# Patient Record
Sex: Male | Born: 1990
Health system: Southern US, Community
[De-identification: ages and names within clinical notes are randomized; demographics above are authoritative.]

## PROBLEM LIST (undated history)

## (undated) DIAGNOSIS — K219 Gastro-esophageal reflux disease without esophagitis: Secondary | ICD-10-CM

## (undated) DIAGNOSIS — F1990 Other psychoactive substance use, unspecified, uncomplicated: Secondary | ICD-10-CM

## (undated) DIAGNOSIS — F1010 Alcohol abuse, uncomplicated: Secondary | ICD-10-CM

## (undated) DIAGNOSIS — F1210 Cannabis abuse, uncomplicated: Secondary | ICD-10-CM

## (undated) DIAGNOSIS — F1410 Cocaine abuse, uncomplicated: Secondary | ICD-10-CM

## (undated) DIAGNOSIS — Z720 Tobacco use: Secondary | ICD-10-CM

## (undated) HISTORY — PX: TYMPANOSTOMY TUBE PLACEMENT: SHX32

---
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
## 2015-08-31 ENCOUNTER — Emergency Department (HOSPITAL_BASED_OUTPATIENT_CLINIC_OR_DEPARTMENT_OTHER): Payer: Self-pay

## 2015-08-31 ENCOUNTER — Inpatient Hospital Stay (HOSPITAL_BASED_OUTPATIENT_CLINIC_OR_DEPARTMENT_OTHER)
Admission: EM | Admit: 2015-08-31 | Discharge: 2015-09-06 | DRG: 871 | Disposition: A | Discharge status: Home / Self Care | Payer: Self-pay | Attending: Internal Medicine | Admitting: Internal Medicine

## 2015-08-31 ENCOUNTER — Encounter (HOSPITAL_BASED_OUTPATIENT_CLINIC_OR_DEPARTMENT_OTHER): Payer: Self-pay | Admitting: *Deleted

## 2015-08-31 DIAGNOSIS — J189 Pneumonia, unspecified organism: Secondary | ICD-10-CM | POA: Diagnosis present

## 2015-08-31 DIAGNOSIS — K219 Gastro-esophageal reflux disease without esophagitis: Secondary | ICD-10-CM | POA: Diagnosis present

## 2015-08-31 DIAGNOSIS — Z792 Long term (current) use of antibiotics: Secondary | ICD-10-CM

## 2015-08-31 DIAGNOSIS — A419 Sepsis, unspecified organism: Principal | ICD-10-CM | POA: Diagnosis present

## 2015-08-31 DIAGNOSIS — F17200 Nicotine dependence, unspecified, uncomplicated: Secondary | ICD-10-CM | POA: Diagnosis present

## 2015-08-31 DIAGNOSIS — F1010 Alcohol abuse, uncomplicated: Secondary | ICD-10-CM | POA: Diagnosis present

## 2015-08-31 DIAGNOSIS — J90 Pleural effusion, not elsewhere classified: Secondary | ICD-10-CM | POA: Diagnosis present

## 2015-08-31 DIAGNOSIS — Z79899 Other long term (current) drug therapy: Secondary | ICD-10-CM

## 2015-08-31 DIAGNOSIS — J850 Gangrene and necrosis of lung: Secondary | ICD-10-CM | POA: Diagnosis present

## 2015-08-31 DIAGNOSIS — R0789 Other chest pain: Secondary | ICD-10-CM | POA: Diagnosis present

## 2015-08-31 DIAGNOSIS — F1210 Cannabis abuse, uncomplicated: Secondary | ICD-10-CM | POA: Diagnosis present

## 2015-08-31 DIAGNOSIS — Z720 Tobacco use: Secondary | ICD-10-CM | POA: Diagnosis present

## 2015-08-31 DIAGNOSIS — R7989 Other specified abnormal findings of blood chemistry: Secondary | ICD-10-CM

## 2015-08-31 DIAGNOSIS — I330 Acute and subacute infective endocarditis: Secondary | ICD-10-CM | POA: Diagnosis present

## 2015-08-31 DIAGNOSIS — R809 Proteinuria, unspecified: Secondary | ICD-10-CM | POA: Diagnosis present

## 2015-08-31 DIAGNOSIS — I76 Septic arterial embolism: Secondary | ICD-10-CM

## 2015-08-31 DIAGNOSIS — F1990 Other psychoactive substance use, unspecified, uncomplicated: Secondary | ICD-10-CM | POA: Diagnosis present

## 2015-08-31 DIAGNOSIS — R778 Other specified abnormalities of plasma proteins: Secondary | ICD-10-CM | POA: Diagnosis present

## 2015-08-31 DIAGNOSIS — R0602 Shortness of breath: Secondary | ICD-10-CM

## 2015-08-31 DIAGNOSIS — R079 Chest pain, unspecified: Secondary | ICD-10-CM

## 2015-08-31 DIAGNOSIS — R590 Localized enlarged lymph nodes: Secondary | ICD-10-CM | POA: Diagnosis present

## 2015-08-31 DIAGNOSIS — I2690 Septic pulmonary embolism without acute cor pulmonale: Secondary | ICD-10-CM | POA: Diagnosis present

## 2015-08-31 DIAGNOSIS — I38 Endocarditis, valve unspecified: Secondary | ICD-10-CM

## 2015-08-31 DIAGNOSIS — F1910 Other psychoactive substance abuse, uncomplicated: Secondary | ICD-10-CM

## 2015-08-31 DIAGNOSIS — F1410 Cocaine abuse, uncomplicated: Secondary | ICD-10-CM | POA: Diagnosis present

## 2015-08-31 DIAGNOSIS — Y900 Blood alcohol level of less than 20 mg/100 ml: Secondary | ICD-10-CM | POA: Diagnosis present

## 2015-08-31 HISTORY — DX: Cannabis abuse, uncomplicated: F12.10

## 2015-08-31 HISTORY — DX: Cocaine abuse, uncomplicated: F14.10

## 2015-08-31 HISTORY — DX: Alcohol abuse, uncomplicated: F10.10

## 2015-08-31 HISTORY — DX: Other psychoactive substance use, unspecified, uncomplicated: F19.90

## 2015-08-31 HISTORY — DX: Tobacco use: Z72.0

## 2015-08-31 HISTORY — DX: Gastro-esophageal reflux disease without esophagitis: K21.9

## 2015-08-31 LAB — COMPREHENSIVE METABOLIC PANEL
ALT: 23 U/L (ref 17–63)
ANION GAP: 12 (ref 5–15)
AST: 32 U/L (ref 15–41)
Albumin: 3.2 g/dL — ABNORMAL LOW (ref 3.5–5.0)
Alkaline Phosphatase: 61 U/L (ref 38–126)
BILIRUBIN TOTAL: 1.9 mg/dL — AB (ref 0.3–1.2)
BUN: 16 mg/dL (ref 6–20)
CO2: 25 mmol/L (ref 22–32)
Calcium: 8.7 mg/dL — ABNORMAL LOW (ref 8.9–10.3)
Chloride: 94 mmol/L — ABNORMAL LOW (ref 101–111)
Creatinine, Ser: 0.96 mg/dL (ref 0.61–1.24)
GFR calc Af Amer: >60 mL/min (ref >60)
Glucose, Bld: 116 mg/dL — ABNORMAL HIGH (ref 65–99)
POTASSIUM: 3.9 mmol/L (ref 3.5–5.1)
Sodium: 131 mmol/L — ABNORMAL LOW (ref 135–145)
TOTAL PROTEIN: 8 g/dL (ref 6.5–8.1)

## 2015-08-31 LAB — I-STAT VENOUS BLOOD GAS, ED
ACID-BASE EXCESS: 2 mmol/L (ref 0.0–2.0)
Bicarbonate: 27.6 mEq/L — ABNORMAL HIGH (ref 20.0–24.0)
O2 Saturation: 59 %
PH VEN: 7.396 — AB (ref 7.250–7.300)
TCO2: 29 mmol/L (ref 0–100)
pCO2, Ven: 45.3 mmHg (ref 45.0–50.0)
pO2, Ven: 32 mmHg (ref 31.0–45.0)

## 2015-08-31 LAB — TROPONIN I: Troponin I: 0.29 ng/mL (ref <0.03)

## 2015-08-31 LAB — URINE MICROSCOPIC-ADD ON

## 2015-08-31 LAB — CBC WITH DIFFERENTIAL/PLATELET
BASOS ABS: 0 10*3/uL (ref 0.0–0.1)
BASOS PCT: 0 %
Band Neutrophils: 12 %
Eosinophils Absolute: 0 10*3/uL (ref 0.0–0.7)
Eosinophils Relative: 0 %
HCT: 48.2 % (ref 39.0–52.0)
Hemoglobin: 17.2 g/dL — ABNORMAL HIGH (ref 13.0–17.0)
LYMPHS ABS: 1.3 10*3/uL (ref 0.7–4.0)
Lymphocytes Relative: 10 %
MCH: 31.7 pg (ref 26.0–34.0)
MCHC: 35.7 g/dL (ref 30.0–36.0)
MCV: 88.8 fL (ref 78.0–100.0)
MONO ABS: 1.7 10*3/uL — AB (ref 0.1–1.0)
Monocytes Relative: 13 %
NEUTROS PCT: 65 %
Neutro Abs: 10.2 10*3/uL — ABNORMAL HIGH (ref 1.7–7.7)
PLATELETS: 155 10*3/uL (ref 150–400)
RBC: 5.43 MIL/uL (ref 4.22–5.81)
RDW: 13.3 % (ref 11.5–15.5)
WBC: 13.2 10*3/uL — ABNORMAL HIGH (ref 4.0–10.5)

## 2015-08-31 LAB — URINALYSIS, ROUTINE W REFLEX MICROSCOPIC
Glucose, UA: 100 mg/dL — AB
Hgb urine dipstick: NEGATIVE
Ketones, ur: 15 mg/dL — AB
NITRITE: NEGATIVE
Protein, ur: 100 mg/dL — AB
SPECIFIC GRAVITY, URINE: 1.029 (ref 1.005–1.030)
pH: 6 (ref 5.0–8.0)

## 2015-08-31 LAB — LIPASE, BLOOD: Lipase: 38 U/L (ref 11–51)

## 2015-08-31 LAB — ETHANOL

## 2015-08-31 LAB — RAPID URINE DRUG SCREEN, HOSP PERFORMED
AMPHETAMINES: NOT DETECTED
BARBITURATES: NOT DETECTED
BENZODIAZEPINES: NOT DETECTED
Cocaine: POSITIVE — AB
Opiates: NOT DETECTED
TETRAHYDROCANNABINOL: POSITIVE — AB

## 2015-08-31 LAB — PROTIME-INR
INR: 1.1
Prothrombin Time: 14.2 seconds (ref 11.4–15.2)

## 2015-08-31 LAB — I-STAT CG4 LACTIC ACID, ED: Lactic Acid, Venous: 1.76 mmol/L (ref 0.5–1.9)

## 2015-08-31 LAB — CBG MONITORING, ED: GLUCOSE-CAPILLARY: 203 mg/dL — AB (ref 65–99)

## 2015-08-31 MED ORDER — SODIUM CHLORIDE 0.9 % IV SOLN
1000.0000 mL | Freq: Once | INTRAVENOUS | Status: AC
  Administered 2015-08-31: 1000 mL via INTRAVENOUS

## 2015-08-31 MED ORDER — SODIUM CHLORIDE 0.9 % IV BOLUS (SEPSIS)
1000.0000 mL | Freq: Once | INTRAVENOUS | Status: AC
  Administered 2015-08-31: 1000 mL via INTRAVENOUS

## 2015-08-31 MED ORDER — SODIUM CHLORIDE 0.9 % IV SOLN
1000.0000 mL | INTRAVENOUS | Status: DC
  Administered 2015-08-31: 1000 mL via INTRAVENOUS

## 2015-08-31 MED ORDER — DEXTROSE 5 % IV SOLN
2.0000 g | Freq: Once | INTRAVENOUS | Status: AC
  Administered 2015-08-31: 2 g via INTRAVENOUS
  Filled 2015-08-31: qty 2

## 2015-08-31 MED ORDER — IBUPROFEN 400 MG PO TABS
600.0000 mg | ORAL_TABLET | Freq: Once | ORAL | Status: AC
  Administered 2015-08-31: 600 mg via ORAL
  Filled 2015-08-31: qty 1

## 2015-08-31 MED ORDER — VANCOMYCIN HCL IN DEXTROSE 1-5 GM/200ML-% IV SOLN
1000.0000 mg | Freq: Once | INTRAVENOUS | Status: AC
  Administered 2015-08-31: 1000 mg via INTRAVENOUS
  Filled 2015-08-31: qty 200

## 2015-08-31 MED ORDER — IOPAMIDOL (ISOVUE-370) INJECTION 76%
100.0000 mL | Freq: Once | INTRAVENOUS | Status: AC | PRN
  Administered 2015-08-31: 100 mL via INTRAVENOUS

## 2015-08-31 NOTE — Progress Notes (Signed)
This is a no charge note  [facility] Dr[facility] Donnald GarrePfeiffer  25 year old male with a past medical history of IV drug use, cocaine abuse, tobacco abuse, 1 amount of abuse, who presents with fever, weakness, chest pain, headache, nausea, vomiting.  Per report, pt drank alcohol and smoked "pot" 6 days ago, then started having fever, weakness, chest pain, headache, nausea, vomiting.  Pt was found to have bilateral septic emboli involving all lobes of both lungs by CTA. There is evidence of central necrosis involving right upper and lower lobe lesions. Partially loculated right pleural effusion, possible loculated pleural fluid versus less likely pericardial fluid. Troponin 0.29, positive UDS for cocaine and PSC, urinalysis with trace amount of leukocyte, lactate 1.76, temperature 102.8, tachycardia, tachypnea, oxygen saturation 78% on room air, blood pressure 98/67, electrolytes and renal function okay. CT-head is negative for acute intracranial abnormalities. CTA of abdomen/pelvis is not impressive. Patient is accepted to stepdown bed as inpatient. IV rocephin and vancomycin were given in ED.  Lorretta HarpXilin Jhordan Kinter, MD  [facility] Pager [phone]  If 7PM-7AM, please contact night-coverage [email or web address] Password [email or web address] 08/31/2015, 10:45 PM

## 2015-08-31 NOTE — ED Provider Notes (Signed)
[facility] Provider Note   CSN: [id number] Arrival date & time: 08/31/15  1704   By signing my name below, I, [name], attest that this documentation has been prepared under the direction and in the presence of Arby Barrette, MD. Electronically Signed: [name], ED Scribe. 08/31/15. 6:50 PM.   History   Chief Complaint Chief Complaint  Patient presents with  . Weakness    HPI  Kenneth Davis is a 25 y.o. male who presents to the Emergency Department complaining of weakness onset 5 days. Pt reports that 6 days ago, he went out partying and then 5 days ago in the early morning, he woke up in his room with EMS surrounding him. Pt states that his friends called EMS and he refused treatment. Pt states that he was drinking ETOH, smoking marijuana, and xanax while partying. Pt states that he has used IV drugs in the past and he has injected cocaine. Pt notes that he is trying to stop using cocaine at this time. Pt notes that he has been in the bed for the past 5 days. Pt reports that he is unsure if he was injured while partying. He states that he is having associated symptoms of throbbing HA, vomiting x 3 episodes with his last episode being 2 days ago, right side pain, and chills. He states that he has tried Gatorade and sprite with no relief for his symptoms. He denies fever and any other symptoms.     The history is provided by the patient. No language interpreter was used.    History reviewed. No pertinent past medical history.  Patient Active Problem List   Diagnosis Date Noted  . Endocarditis 09/01/2015    History reviewed. No pertinent surgical history.     Home Medications    Prior to Admission medications   Medication Sig Start Date End Date Taking? Authorizing Provider  doxycycline (DORYX) 100 MG EC tablet Take 100 mg by mouth 2 (two) times daily.      Historical Provider, MD  famotidine (PEPCID) 20 MG tablet Take 1 tablet (20 mg total) by mouth  2 (two) times daily. 10/27/10 10/27/11  Linwood Dibbles, MD    Family History No family history on file.  Social History Social History  Substance Use Topics  . Smoking status: Current Some Day Smoker  . Smokeless tobacco: Never Used  . Alcohol use Yes     Allergies   Review of patient's allergies indicates no known allergies.   Review of Systems Review of Systems  Constitutional: Positive for chills. Negative for fever.  Gastrointestinal: Positive for vomiting.  Musculoskeletal: Positive for arthralgias (right side).  Neurological: Positive for weakness and headaches.     Physical Exam Updated Vital Signs BP 98/67   Pulse 102   Temp 102.8 F (39.3 C) (Oral)   Resp 25   Ht 5\' 10"  (1.778 m)   Wt 140 lb (63.5 kg)   SpO2 95%   BMI 20.09 kg/m   Physical Exam  Constitutional: He is oriented to person, place, and time. He appears well-developed and well-nourished. No distress.  HENT:  Head: Normocephalic and atraumatic.  Plaque on tongue, oral thrush, gingivitis.  Eyes: EOM are normal. Scleral icterus is present.  Scleral icterus.   Neck: Neck supple.  Cardiovascular: Normal rate, regular rhythm and normal heart sounds.  Exam reveals no gallop and no friction rub.   No murmur heard. Pulmonary/Chest: Effort normal. No respiratory distress. He has decreased breath sounds in the  right upper field, the right middle field and the right lower field. He has no wheezes. He has no rales.  Decreased breathe sounds on right  Abdominal: Soft. He exhibits no distension. There is tenderness in the right lower quadrant. There is CVA tenderness.  Right CVA tenderness. RLQ pain to palpation.   Musculoskeletal: Normal range of motion. He exhibits no edema.  No peripheral edema.   Neurological: He is alert and oriented to person, place, and time.  Skin: Skin is warm and dry.  Psychiatric: He has a normal mood and affect. His behavior is normal.  Nursing note and vitals  reviewed.    ED Treatments / Results  DIAGNOSTIC STUDIES: Oxygen Saturation is 97% on RA, nl by my interpretation.    COORDINATION OF CARE: 6:48 PM Discussed treatment plan with pt at bedside which includes UA, labs, and CXR, and pt agreed to plan.   Labs (all labs ordered are listed, but only abnormal results are displayed) Labs Reviewed  URINALYSIS, ROUTINE W REFLEX MICROSCOPIC (NOT AT [facility]) - Abnormal; Notable for the following:       Result Value   Color, Urine ORANGE (*)    Glucose, UA 100 (*)    Bilirubin Urine SMALL (*)    Ketones, ur 15 (*)    Protein, ur 100 (*)    Leukocytes, UA TRACE (*)    All other components within normal limits  URINE MICROSCOPIC-ADD ON - Abnormal; Notable for the following:    Squamous Epithelial / LPF 0-5 (*)    Bacteria, UA FEW (*)    All other components within normal limits  COMPREHENSIVE METABOLIC PANEL - Abnormal; Notable for the following:    Sodium 131 (*)    Chloride 94 (*)    Glucose, Bld 116 (*)    Calcium 8.7 (*)    Albumin 3.2 (*)    Total Bilirubin 1.9 (*)    All other components within normal limits  CBC WITH DIFFERENTIAL/PLATELET - Abnormal; Notable for the following:    WBC 13.2 (*)    Hemoglobin 17.2 (*)    Neutro Abs 10.2 (*)    Monocytes Absolute 1.7 (*)    All other components within normal limits  TROPONIN I - Abnormal; Notable for the following:    Troponin I 0.29 (*)    All other components within normal limits  URINE RAPID DRUG SCREEN, HOSP PERFORMED - Abnormal; Notable for the following:    Cocaine POSITIVE (*)    Tetrahydrocannabinol POSITIVE (*)    All other components within normal limits  CBG MONITORING, ED - Abnormal; Notable for the following:    Glucose-Capillary 203 (*)    All other components within normal limits  I-STAT VENOUS BLOOD GAS, ED - Abnormal; Notable for the following:    pH, Ven 7.396 (*)    Bicarbonate 27.6 (*)    All other components within normal limits  CULTURE, BLOOD (ROUTINE X  2)  CULTURE, BLOOD (ROUTINE X 2)  CULTURE, BLOOD (ROUTINE X 2)  CULTURE, BLOOD (ROUTINE X 2)  ETHANOL  LIPASE, BLOOD  PROTIME-INR  BLOOD GAS, VENOUS  I-STAT CG4 LACTIC ACID, ED    EKG  EKG Interpretation None       Radiology Dg Chest 2 View  Result Date: 08/31/2015 CLINICAL DATA:  Right-sided chest pain. EXAM: CHEST  2 VIEW COMPARISON:  None. FINDINGS: Mild right upper lobe airspace opacity. No other evidence of focal consolidation. No pulmonary edema. Normal cardiomediastinal contours. No pleural effusion or  pneumothorax. IMPRESSION: Mild right upper lobe airspace opacity could indicate developing infection. Otherwise normal chest radiograph. Electronically Signed   By: Deatra Robinson M.D.   On: 08/31/2015 19:16   Ct Head Wo Contrast  Result Date: 08/31/2015 CLINICAL DATA:  Patient was drinking and doing drugs last night and now complains of chest pain radiating to the mid abdomen. Headache. EXAM: CT HEAD WITHOUT CONTRAST TECHNIQUE: Contiguous axial images were obtained from the base of the skull through the vertex without intravenous contrast. COMPARISON:  06/21/2008 FINDINGS: Brain: Examination technically limited due to motion artifact. Ventricles and sulci appear symmetrical. No ventricular dilatation. No mass effect or midline shift. No abnormal extra-axial fluid collections. Gray-white matter junctions are distinct. Basal cisterns are not effaced. No evidence of acute intracranial hemorrhage. Vascular: No hyperdense vessel or unexpected calcification. Skull: No depressed skull fractures. Sinuses/Orbits: No acute finding. Other: No significant changes since previous study. IMPRESSION: No acute intracranial abnormalities. Electronically Signed   By: Burman Nieves M.D.   On: 08/31/2015 21:22   Ct Angio Chest Aorta W/cm &/or Wo/cm  Result Date: 08/31/2015 CLINICAL DATA:  Chest pain radiating to the abdomen. History of IV drug use. EXAM: CT ANGIOGRAPHY CHEST, ABDOMEN AND PELVIS  TECHNIQUE: Multidetector CT imaging through the chest, abdomen and pelvis was performed using the standard protocol during bolus administration of intravenous contrast. Multiplanar reconstructed images and MIPs were obtained and reviewed to evaluate the vascular anatomy. CONTRAST:  100 cc Isovue 370 IV COMPARISON:  Chest radiograph earlier this day. FINDINGS: CTA CHEST FINDINGS Cardiovascular: No aortic dissection, aneurysm, hematoma, or acute aortic syndrome. Thoracic aorta is normal in caliber. Heart is normal in size. No filling defects in the central pulmonary arteries. Mediastinum/nodes: Fluid abutting the right mediastinal border measuring up to 3.4 cm may be a loculated pleural effusion versus less likely pericardial effusion. This is heterogeneous fluid density with minimal peripheral but no internal enhancement. Enlarged right hilar node measures 14 mm. Small prevascular nodes. There is a prominent subcarinal node. Probable epicardial node. Lungs/pleura: Multifocal pulmonary nodules in a pattern consistent with septic emboli/infection. These are bilateral and in falls all lobes of both lungs. Greatest burden is in the upper lobes were there is probable early cavitation. Nodule conglomerate in the upper lobe measures 3.4 x 1.9 cm. Within the medial right lung base his consolidation with central air, suspicious for necrotizing infection/ pneumonia. There is a partially loculated right pleural effusion measuring up to 2 cm, some peripheral enhancement. Trace left pleural effusion. Musculoskeletal: There are no acute or suspicious osseous abnormalities. Review of the MIP images confirms the above findings. CTA ABDOMEN AND PELVIS FINDINGS Aorta/vascular: Abdominal aorta is normal in caliber. There is no dissection, aneurysm, or acute aortic syndrome. Normal variant anatomy with small likely a accessory hepatic Ing gastric artery arising directly from the aorta. Celiac, superior mesenteric, and inferior  mesenteric arteries are patent. Single bilateral renal arteries are patent. Normal caliber bilateral common and external iliac arteries. Liver: Normal for arterial phase imaging. Hepatobiliary: Gallbladder physiologically distended, no calcified stone. No biliary dilatation. Pancreas: No ductal dilatation or inflammation. Spleen: Normal for arterial phase imaging. Adrenal glands: No nodule. Kidneys: Symmetric renal enhancement. No hydronephrosis. No perinephric edema. Stomach/Bowel: Stomach physiologically distended. There are no dilated or thickened small bowel loops. Small volume of stool throughout the colon without colonic wall thickening. The appendix is normal. Vascular/Lymphatic: No retroperitoneal, mesenteric, or pelvic adenopathy. Reproductive: No acute abnormality. Bladder: Physiologically distended without wall thickening. Other: No free air, free  fluid, or intra-abdominal fluid collection. Musculoskeletal: There are no acute or suspicious osseous abnormalities. Review of the MIP images confirms the above findings. IMPRESSION: 1. Findings consistent with bilateral septic emboli involving all lobes of both lungs. There is evidence of central necrosis involving right upper and lower lobe lesions. Partially loculated right pleural effusion. Fluid adjacent of right heart border is likely loculated pleural fluid versus less likely pericardial fluid. Mediastinal and right hilar adenopathy is likely reactive. 2. No aortic dissection. 3. No acute abnormality in the abdomen/pelvis. These results were called by telephone at the time of interpretation on 08/31/2015 at 10:04 pm to Dr. Arby Barrette , who verbally acknowledged these results. Electronically Signed   By: Rubye Oaks M.D.   On: 08/31/2015 22:05   Ct Angio Abd/pel W/ And/or W/o  Result Date: 08/31/2015 CLINICAL DATA:  Chest pain radiating to the abdomen. History of IV drug use. EXAM: CT ANGIOGRAPHY CHEST, ABDOMEN AND PELVIS TECHNIQUE:  Multidetector CT imaging through the chest, abdomen and pelvis was performed using the standard protocol during bolus administration of intravenous contrast. Multiplanar reconstructed images and MIPs were obtained and reviewed to evaluate the vascular anatomy. CONTRAST:  100 cc Isovue 370 IV COMPARISON:  Chest radiograph earlier this day. FINDINGS: CTA CHEST FINDINGS Cardiovascular: No aortic dissection, aneurysm, hematoma, or acute aortic syndrome. Thoracic aorta is normal in caliber. Heart is normal in size. No filling defects in the central pulmonary arteries. Mediastinum/nodes: Fluid abutting the right mediastinal border measuring up to 3.4 cm may be a loculated pleural effusion versus less likely pericardial effusion. This is heterogeneous fluid density with minimal peripheral but no internal enhancement. Enlarged right hilar node measures 14 mm. Small prevascular nodes. There is a prominent subcarinal node. Probable epicardial node. Lungs/pleura: Multifocal pulmonary nodules in a pattern consistent with septic emboli/infection. These are bilateral and in falls all lobes of both lungs. Greatest burden is in the upper lobes were there is probable early cavitation. Nodule conglomerate in the upper lobe measures 3.4 x 1.9 cm. Within the medial right lung base his consolidation with central air, suspicious for necrotizing infection/ pneumonia. There is a partially loculated right pleural effusion measuring up to 2 cm, some peripheral enhancement. Trace left pleural effusion. Musculoskeletal: There are no acute or suspicious osseous abnormalities. Review of the MIP images confirms the above findings. CTA ABDOMEN AND PELVIS FINDINGS Aorta/vascular: Abdominal aorta is normal in caliber. There is no dissection, aneurysm, or acute aortic syndrome. Normal variant anatomy with small likely a accessory hepatic Ing gastric artery arising directly from the aorta. Celiac, superior mesenteric, and inferior mesenteric arteries  are patent. Single bilateral renal arteries are patent. Normal caliber bilateral common and external iliac arteries. Liver: Normal for arterial phase imaging. Hepatobiliary: Gallbladder physiologically distended, no calcified stone. No biliary dilatation. Pancreas: No ductal dilatation or inflammation. Spleen: Normal for arterial phase imaging. Adrenal glands: No nodule. Kidneys: Symmetric renal enhancement. No hydronephrosis. No perinephric edema. Stomach/Bowel: Stomach physiologically distended. There are no dilated or thickened small bowel loops. Small volume of stool throughout the colon without colonic wall thickening. The appendix is normal. Vascular/Lymphatic: No retroperitoneal, mesenteric, or pelvic adenopathy. Reproductive: No acute abnormality. Bladder: Physiologically distended without wall thickening. Other: No free air, free fluid, or intra-abdominal fluid collection. Musculoskeletal: There are no acute or suspicious osseous abnormalities. Review of the MIP images confirms the above findings. IMPRESSION: 1. Findings consistent with bilateral septic emboli involving all lobes of both lungs. There is evidence of central necrosis involving right upper  and lower lobe lesions. Partially loculated right pleural effusion. Fluid adjacent of right heart border is likely loculated pleural fluid versus less likely pericardial fluid. Mediastinal and right hilar adenopathy is likely reactive. 2. No aortic dissection. 3. No acute abnormality in the abdomen/pelvis. These results were called by telephone at the time of interpretation on 08/31/2015 at 10:04 pm to Dr. Arby BarretteMARCY Octavion Mollenkopf , who verbally acknowledged these results. Electronically Signed   By: Rubye OaksMelanie  Ehinger M.D.   On: 08/31/2015 22:05    Procedures Procedures (including critical care time) CRITICAL CARE Performed by: Arby BarrettePfeiffer, Akiera Allbaugh   Total critical care time: 60 minutes  Critical care time was exclusive of separately billable procedures and  treating other patients.  Critical care was necessary to treat or prevent imminent or life-threatening deterioration.  Critical care was time spent personally by me on the following activities: development of treatment plan with patient and/or surrogate as well as nursing, discussions with consultants, evaluation of patient's response to treatment, examination of patient, obtaining history from patient or surrogate, ordering and performing treatments and interventions, ordering and review of laboratory studies, ordering and review of radiographic studies, pulse oximetry and re-evaluation of patient's condition. Medications Ordered in ED Medications  0.9 %  sodium chloride infusion (0 mLs Intravenous Stopped 08/31/15 2308)    Followed by  0.9 %  sodium chloride infusion (1,000 mLs Intravenous New Bag/Given 08/31/15 2216)  sodium chloride 0.9 % bolus 1,000 mL (0 mLs Intravenous Stopped 08/31/15 2021)  iopamidol (ISOVUE-370) 76 % injection 100 mL (100 mLs Intravenous Contrast Given 08/31/15 2035)  vancomycin (VANCOCIN) IVPB 1000 mg/200 mL premix (1,000 mg Intravenous New Bag/Given 08/31/15 2235)  cefTRIAXone (ROCEPHIN) 2 g in dextrose 5 % 50 mL IVPB (2 g Intravenous New Bag/Given 08/31/15 2235)  ibuprofen (ADVIL,MOTRIN) tablet 600 mg (600 mg Oral Given 08/31/15 2305)     Initial Impression / Assessment and Plan / ED Course  I have reviewed the triage vital signs and the nursing notes.  Pertinent labs & imaging results that were available during my care of the patient were reviewed by me and considered in my medical decision making (see chart for details).  Clinical Course  Consult: (20:30) discussed with Dr. Paulita CradleGhahngi cardiology, we reviewed EKG findings and troponin. At this time EKG is not suggestive of STEMI with diffuse ST elevation and no reciprocal changes. We'll continue diagnostic evaluation and plan to admit to hospitalist group with cycling troponins and EKGs. Consult:Dr. Clyde LundborgNiu [facility]  for admission. Final Clinical Impressions(s) / ED Diagnoses   Final diagnoses:  Endocarditis  Septic embolism (HCC)  Drug abuse  Multiple rechecks of patient condition. CT confirms septic emboli. Patient's respiratory status remained stable without respiratory distress. Patient's mental status remained stable, A&O x3. He is treated with broad-spectrum antibiotics [facility] New Prescriptions New Prescriptions   No medications on file     Arby BarretteMarcy Demani Weyrauch, MD 09/29/15 [phone]

## 2015-08-31 NOTE — ED Triage Notes (Signed)
Woke Sunday after drinking and smoking "pot" the night before. States his friends had called EMS but he refused transport. States he has not gotten out of the bed since Sunday.

## 2015-08-31 NOTE — ED Notes (Signed)
Patient transported to CT 

## 2015-09-01 ENCOUNTER — Encounter (HOSPITAL_COMMUNITY): Payer: Self-pay | Admitting: Internal Medicine

## 2015-09-01 DIAGNOSIS — R112 Nausea with vomiting, unspecified: Secondary | ICD-10-CM | POA: Insufficient documentation

## 2015-09-01 DIAGNOSIS — R079 Chest pain, unspecified: Secondary | ICD-10-CM | POA: Diagnosis present

## 2015-09-01 DIAGNOSIS — Z720 Tobacco use: Secondary | ICD-10-CM | POA: Diagnosis present

## 2015-09-01 DIAGNOSIS — J189 Pneumonia, unspecified organism: Secondary | ICD-10-CM | POA: Diagnosis present

## 2015-09-01 DIAGNOSIS — F1010 Alcohol abuse, uncomplicated: Secondary | ICD-10-CM

## 2015-09-01 DIAGNOSIS — F1210 Cannabis abuse, uncomplicated: Secondary | ICD-10-CM

## 2015-09-01 DIAGNOSIS — F1410 Cocaine abuse, uncomplicated: Secondary | ICD-10-CM | POA: Diagnosis present

## 2015-09-01 DIAGNOSIS — A419 Sepsis, unspecified organism: Secondary | ICD-10-CM | POA: Diagnosis present

## 2015-09-01 DIAGNOSIS — R7989 Other specified abnormal findings of blood chemistry: Secondary | ICD-10-CM | POA: Diagnosis present

## 2015-09-01 DIAGNOSIS — K219 Gastro-esophageal reflux disease without esophagitis: Secondary | ICD-10-CM | POA: Diagnosis present

## 2015-09-01 DIAGNOSIS — I38 Endocarditis, valve unspecified: Secondary | ICD-10-CM | POA: Insufficient documentation

## 2015-09-01 DIAGNOSIS — I2690 Septic pulmonary embolism without acute cor pulmonale: Secondary | ICD-10-CM

## 2015-09-01 DIAGNOSIS — R778 Other specified abnormalities of plasma proteins: Secondary | ICD-10-CM | POA: Diagnosis present

## 2015-09-01 DIAGNOSIS — F1990 Other psychoactive substance use, unspecified, uncomplicated: Secondary | ICD-10-CM

## 2015-09-01 LAB — MRSA PCR SCREENING: MRSA BY PCR: NEGATIVE

## 2015-09-01 LAB — TROPONIN I
TROPONIN I: 0.06 ng/mL — AB (ref <0.03)
Troponin I: 0.06 ng/mL (ref <0.03)

## 2015-09-01 LAB — LACTIC ACID, PLASMA: Lactic Acid, Venous: 1 mmol/L (ref 0.5–1.9)

## 2015-09-01 LAB — LIPID PANEL
CHOL/HDL RATIO: 6.3 ratio
CHOLESTEROL: 82 mg/dL (ref 0–200)
HDL: 13 mg/dL — AB (ref >40)
LDL Cholesterol: 55 mg/dL (ref 0–99)
TRIGLYCERIDES: 71 mg/dL (ref <150)
VLDL: 14 mg/dL (ref 0–40)

## 2015-09-01 LAB — PROCALCITONIN: Procalcitonin: 5.41 ng/mL

## 2015-09-01 LAB — GLUCOSE, CAPILLARY: GLUCOSE-CAPILLARY: 101 mg/dL — AB (ref 65–99)

## 2015-09-01 LAB — HIV ANTIBODY (ROUTINE TESTING W REFLEX): HIV Screen 4th Generation wRfx: NONREACTIVE

## 2015-09-01 MED ORDER — ADULT MULTIVITAMIN W/MINERALS CH
1.0000 | ORAL_TABLET | Freq: Every day | ORAL | Status: DC
  Administered 2015-09-01 – 2015-09-06 (×6): 1 via ORAL
  Filled 2015-09-01 (×6): qty 1

## 2015-09-01 MED ORDER — LORAZEPAM 1 MG PO TABS
1.0000 mg | ORAL_TABLET | Freq: Four times a day (QID) | ORAL | Status: AC | PRN
  Administered 2015-09-01: 1 mg via ORAL
  Filled 2015-09-01: qty 1

## 2015-09-01 MED ORDER — SALINE SPRAY 0.65 % NA SOLN
1.0000 | NASAL | Status: DC | PRN
  Filled 2015-09-01: qty 44

## 2015-09-01 MED ORDER — MUSCLE RUB 10-15 % EX CREA
1.0000 "application " | TOPICAL_CREAM | CUTANEOUS | Status: DC | PRN
  Filled 2015-09-01: qty 85

## 2015-09-01 MED ORDER — SODIUM CHLORIDE 0.9 % IV SOLN
INTRAVENOUS | Status: AC
Start: 2015-09-01 — End: 2015-09-01
  Administered 2015-09-01: 10:00:00 via INTRAVENOUS

## 2015-09-01 MED ORDER — HYDROCORTISONE 1 % EX CREA
1.0000 "application " | TOPICAL_CREAM | Freq: Three times a day (TID) | CUTANEOUS | Status: DC | PRN
  Filled 2015-09-01: qty 28

## 2015-09-01 MED ORDER — LORAZEPAM 2 MG/ML IJ SOLN: 1.0000 mg | Freq: Four times a day (QID) | INTRAMUSCULAR | Status: AC | PRN

## 2015-09-01 MED ORDER — LORAZEPAM 2 MG/ML IJ SOLN: 0.0000 mg | Freq: Two times a day (BID) | INTRAMUSCULAR | Status: DC

## 2015-09-01 MED ORDER — FOLIC ACID 1 MG PO TABS
1.0000 mg | ORAL_TABLET | Freq: Every day | ORAL | Status: DC
  Administered 2015-09-01 – 2015-09-06 (×6): 1 mg via ORAL
  Filled 2015-09-01 (×6): qty 1

## 2015-09-01 MED ORDER — THIAMINE HCL 100 MG/ML IJ SOLN: 100.0000 mg | Freq: Every day | INTRAMUSCULAR | Status: DC

## 2015-09-01 MED ORDER — NICOTINE 21 MG/24HR TD PT24
21.0000 mg | MEDICATED_PATCH | Freq: Every day | TRANSDERMAL | Status: DC
  Filled 2015-09-01 (×4): qty 1

## 2015-09-01 MED ORDER — ASPIRIN 325 MG PO TABS
325.0000 mg | ORAL_TABLET | Freq: Every day | ORAL | Status: DC
  Administered 2015-09-01: 325 mg via ORAL
  Filled 2015-09-01 (×2): qty 1

## 2015-09-01 MED ORDER — ACETAMINOPHEN 325 MG PO TABS
650.0000 mg | ORAL_TABLET | Freq: Four times a day (QID) | ORAL | Status: DC | PRN
  Administered 2015-09-01 – 2015-09-04 (×8): 650 mg via ORAL
  Filled 2015-09-01 (×8): qty 2

## 2015-09-01 MED ORDER — POLYVINYL ALCOHOL 1.4 % OP SOLN
1.0000 [drp] | OPHTHALMIC | Status: DC | PRN
  Filled 2015-09-01: qty 15

## 2015-09-01 MED ORDER — LORAZEPAM 2 MG/ML IJ SOLN: 0.0000 mg | Freq: Four times a day (QID) | INTRAMUSCULAR | Status: AC

## 2015-09-01 MED ORDER — ALBUTEROL SULFATE (2.5 MG/3ML) 0.083% IN NEBU: 2.5000 mg | INHALATION_SOLUTION | RESPIRATORY_TRACT | Status: DC | PRN

## 2015-09-01 MED ORDER — DEXTROSE 5 % IV SOLN
2.0000 g | Freq: Three times a day (TID) | INTRAVENOUS | Status: DC
  Administered 2015-09-01 – 2015-09-03 (×7): 2 g via INTRAVENOUS
  Filled 2015-09-01 (×11): qty 2

## 2015-09-01 MED ORDER — LORATADINE 10 MG PO TABS: 10.0000 mg | ORAL_TABLET | Freq: Every day | ORAL | Status: DC | PRN

## 2015-09-01 MED ORDER — ONDANSETRON HCL 4 MG/2ML IJ SOLN
4.0000 mg | Freq: Four times a day (QID) | INTRAMUSCULAR | Status: DC | PRN
  Administered 2015-09-02: 4 mg via INTRAVENOUS
  Filled 2015-09-01: qty 2

## 2015-09-01 MED ORDER — NITROGLYCERIN 0.4 MG SL SUBL: 0.4000 mg | SUBLINGUAL_TABLET | SUBLINGUAL | Status: DC | PRN

## 2015-09-01 MED ORDER — HYDROCORTISONE 2.5 % RE CREA
1.0000 | TOPICAL_CREAM | Freq: Four times a day (QID) | RECTAL | Status: DC | PRN
Start: 2015-09-01 — End: 2015-09-06
  Filled 2015-09-01: qty 28.35

## 2015-09-01 MED ORDER — FAMOTIDINE 20 MG PO TABS
20.0000 mg | ORAL_TABLET | Freq: Two times a day (BID) | ORAL | Status: DC
  Administered 2015-09-01 – 2015-09-06 (×11): 20 mg via ORAL
  Filled 2015-09-01 (×11): qty 1

## 2015-09-01 MED ORDER — ENOXAPARIN SODIUM 40 MG/0.4ML ~~LOC~~ SOLN
40.0000 mg | SUBCUTANEOUS | Status: DC
  Administered 2015-09-01: 40 mg via SUBCUTANEOUS
  Filled 2015-09-01 (×2): qty 0.4

## 2015-09-01 MED ORDER — GUAIFENESIN-DM 100-10 MG/5ML PO SYRP
5.0000 mL | ORAL_SOLUTION | ORAL | Status: DC | PRN
  Administered 2015-09-03 – 2015-09-06 (×3): 5 mL via ORAL
  Filled 2015-09-01 (×3): qty 5

## 2015-09-01 MED ORDER — PHENOL 1.4 % MT LIQD: 1.0000 | OROMUCOSAL | Status: DC | PRN

## 2015-09-01 MED ORDER — HYDROCOD POLST-CPM POLST ER 10-8 MG/5ML PO SUER
5.0000 mL | Freq: Two times a day (BID) | ORAL | Status: DC | PRN
  Administered 2015-09-01 – 2015-09-05 (×9): 5 mL via ORAL
  Filled 2015-09-01 (×10): qty 5

## 2015-09-01 MED ORDER — ALUM & MAG HYDROXIDE-SIMETH 200-200-20 MG/5ML PO SUSP
30.0000 mL | ORAL | Status: DC | PRN
Start: 2015-09-01 — End: 2015-09-06

## 2015-09-01 MED ORDER — VITAMIN B-1 100 MG PO TABS
100.0000 mg | ORAL_TABLET | Freq: Every day | ORAL | Status: DC
  Administered 2015-09-01 – 2015-09-06 (×6): 100 mg via ORAL
  Filled 2015-09-01 (×6): qty 1

## 2015-09-01 MED ORDER — SODIUM CHLORIDE 0.9 % IV SOLN: INTRAVENOUS | Status: AC

## 2015-09-01 MED ORDER — VANCOMYCIN HCL IN DEXTROSE 1-5 GM/200ML-% IV SOLN
1000.0000 mg | Freq: Three times a day (TID) | INTRAVENOUS | Status: DC
  Administered 2015-09-01 – 2015-09-04 (×12): 1000 mg via INTRAVENOUS
  Filled 2015-09-01 (×13): qty 200

## 2015-09-01 MED ORDER — OXYCODONE HCL 5 MG PO TABS
10.0000 mg | ORAL_TABLET | ORAL | Status: DC | PRN
  Administered 2015-09-01 – 2015-09-06 (×10): 10 mg via ORAL
  Filled 2015-09-01 (×10): qty 2

## 2015-09-01 MED ORDER — LIP MEDEX EX OINT
1.0000 "application " | TOPICAL_OINTMENT | CUTANEOUS | Status: DC | PRN
  Filled 2015-09-01: qty 7

## 2015-09-01 NOTE — Progress Notes (Addendum)
Pharmacy Antibiotic Note  Kenneth Davis is a 25 y.o. male admitted on 08/31/2015 with septic emboli, IVDU.  Pharmacy has been consulted for cefepime and vancomycin dosing.  PMH significant for IVDU.  CTA found B septic emboli involving all lobes of both lungs. Temp 102.8, WBC 13.2, creat 0.96. Lactate 1.76. He was given rocephin 2 gm and vanc 1 gm in the ED  Plan: Vancomycin 1000 IV every 8 hours.  Goal trough 15-20 mcg/mL.  Cefepime 2 mg IV q8h F/u renal fxn. Temp, wbc, culture data VT as needed  Height: 5\' 10"  (177.8 cm) Weight: 140 lb (63.5 kg) IBW/kg (Calculated) : 73  Temp (24hrs), Avg:100.7 F (38.2 C), Min:99.7 F (37.6 C), Max:102.8 F (39.3 C)   Recent Labs Lab 08/31/15 1900 08/31/15 1917  WBC 13.2*  --   CREATININE 0.96  --   LATICACIDVEN  --  1.76    Estimated Creatinine Clearance: 107.5 mL/min (by C-G formula based on SCr of 0.96 mg/dL).    No Known Allergies  Thank you for allowing pharmacy to be a part of this patient's care.  Herby AbrahamMichelle T. Winda Summerall, Pharm.D. [phone] 09/01/2015 3:00 AM

## 2015-09-01 NOTE — Progress Notes (Addendum)
PROGRESS NOTE        PATIENT DETAILS Name: Kenneth Davis Age: 25 y.o. Sex: male Date of Birth: 06/13/1990 Admit Date: 08/31/2015 Admitting Physician Lorretta Harp, MD PCP:No PCP Per Patient  Brief Narrative: Patient is a 25 y.o. male with history of IV drug use (cocaine), marijuana use presented with almost 1 week history of fever with chills, generalized body aches and headaches and cough. Found to be febrile on initial presentation to the ED, underwent CT scan of the head, chest and abdomen. CT chest was consistent with bilateral septic emboli. CT head did not show any acute abnormalities. Patient was presumed to have infective endocarditis, and admitted to the hospitalist service. Blood cultures are currently pending.  Subjective: Awake and alert-continues to have mild generalized headache, main issue seems to be persistent cough.  Assessment/Plan: Principal Problem: Sepsis secondary to presumed infective endocarditis with septic emboli to the lungs: Sepsis pathophysiology is slowly improving with empiric antibiotics and IV fluids. Blood cultures are currently pending, await TTE, but suspect will require a TEE. I will go ahead and consult infectious disease.   Active Problems: Chest pain: Atypical as it is mostly bilateral, likley secondary to multiple septic emboli- did have minimally elevated troponin-trend is flat and not consistent with ACS. Apart from an echo doubt further workup is required.  GERD: Continue Pepcid  Polysubstance abuse: Acknowledges cocaine (claims last use was 3-4 weeks back), marijuana and occasional alcohol use. Continue Ativan per CIWA protocol. Social work consultation prior to discharge for outpatient substance abuse rehabilitation resources. He has been counseled extensively.  DVT Prophylaxis: Prophylactic Lovenox   Code Status: Full code   Family Communication: None at bedside if in patient is awake and alert and just admitted  this morning. He is understanding of above noted plan. We will attempt to reach out to patient's family after he gives [place] permission over the next few days.  Disposition Plan: Remain inpatient-but will likely require home health IV antibiotics on discharge. Will require several more days of hospitalization.  Antimicrobial agents: IV vancomycin 8/25>> IV cefepime 8/26>>  Procedures: None  CONSULTS:  ID  Time spent: 25 minutes-Greater than 50% of this time was spent in counseling, explanation of diagnosis, planning of further management, and coordination of care.  MEDICATIONS: Anti-infectives    Start     Dose/Rate Route Frequency Ordered Stop   09/01/15 0600  ceFEPIme (MAXIPIME) 2 g in dextrose 5 % 50 mL IVPB     2 g 100 mL/hr over 30 Minutes Intravenous Every 8 hours 09/01/15 0252     09/01/15 0600  vancomycin (VANCOCIN) IVPB 1000 mg/200 mL premix     1,000 mg 200 mL/hr over 60 Minutes Intravenous Every 8 hours 09/01/15 0304     08/31/15 2200  vancomycin (VANCOCIN) IVPB 1000 mg/200 mL premix     1,000 mg 200 mL/hr over 60 Minutes Intravenous  Once 08/31/15 2158 09/01/15 0001   08/31/15 2200  cefTRIAXone (ROCEPHIN) 2 g in dextrose 5 % 50 mL IVPB     2 g 100 mL/hr over 30 Minutes Intravenous  Once 08/31/15 2158 09/01/15 0031      Scheduled Meds: . sodium chloride   Intravenous STAT  . aspirin  325 mg Oral Daily  . ceFEPime (MAXIPIME) IV  2 g Intravenous Q8H  . famotidine  20 mg Oral BID  .  folic acid  1 mg Oral Daily  . LORazepam  0-4 mg Intravenous Q6H   Followed by  . [START ON 09/03/2015] LORazepam  0-4 mg Intravenous Q12H  . multivitamin with minerals  1 tablet Oral Daily  . nicotine  21 mg Transdermal Daily  . thiamine  100 mg Oral Daily   Or  . thiamine  100 mg Intravenous Daily  . vancomycin  1,000 mg Intravenous Q8H   Continuous Infusions: . sodium chloride Stopped (09/01/15 0214)   PRN Meds:.acetaminophen, albuterol, alum & mag hydroxide-simeth,  chlorpheniramine-HYDROcodone, guaiFENesin-dextromethorphan, hydrocortisone, hydrocortisone cream, lip balm, loratadine, LORazepam **OR** LORazepam, MUSCLE RUB, nitroGLYCERIN, ondansetron (ZOFRAN) IV, oxyCODONE, phenol, polyvinyl alcohol, sodium chloride   PHYSICAL EXAM: Vital signs: Vitals:   09/01/15 0310 09/01/15 0500 09/01/15 0741 09/01/15 1146  BP: 104/70 112/67 113/68 114/70  Pulse:  85 94 97  Resp: (!) 24 (!) 26 (!) 26 (!) 24  Temp:  100.3 F (37.9 C) (!) 100.7 F (38.2 C) 98.1 F (36.7 C)  TempSrc:  Oral Oral Axillary  SpO2:  98% 98%   Weight:      Height:       Filed Weights   08/31/15 1717 09/01/15 0200  Weight: 63.5 kg (140 lb) 63.5 kg (140 lb)   Body mass index is 20.09 kg/m.   Gen Exam: Awake and alert with clear speech. Not in any distress Neck: Supple, No JVD.   Chest: B/L Clear.   CVS: S1 S2 Regular, Tachycardic-unable to appreciate murmurs Abdomen: soft, BS +, non tender, non distended.  Extremities: no edema, lower extremities warm to touch. Neurologic: Non Focal.   Skin: No Rash or lesions   Wounds: N/A.    I have personally reviewed following labs and imaging studies  LABORATORY DATA: CBC:  Recent Labs Lab 08/31/15 1900  WBC 13.2*  NEUTROABS 10.2*  HGB 17.2*  HCT 48.2  MCV 88.8  PLT 155    Basic Metabolic Panel:  Recent Labs Lab 08/31/15 1900  NA 131*  K 3.9  CL 94*  CO2 25  GLUCOSE 116*  BUN 16  CREATININE 0.96  CALCIUM 8.7*    GFR: Estimated Creatinine Clearance: 107.5 mL/min (by C-G formula based on SCr of 0.96 mg/dL).  Liver Function Tests:  Recent Labs Lab 08/31/15 1900  AST 32  ALT 23  ALKPHOS 61  BILITOT 1.9*  PROT 8.0  ALBUMIN 3.2*    Recent Labs Lab 08/31/15 1900  LIPASE 38   No results for input(s): AMMONIA in the last 168 hours.  Coagulation Profile:  Recent Labs Lab 08/31/15 1900  INR 1.10    Cardiac Enzymes:  Recent Labs Lab 08/31/15 1900 09/01/15 0526 09/01/15 1056  TROPONINI  0.29* 0.06* 0.06*    BNP (last 3 results) No results for input(s): PROBNP in the last 8760 hours.  HbA1C: No results for input(s): HGBA1C in the last 72 hours.  CBG:  Recent Labs Lab 08/31/15 1722  GLUCAP 203*    Lipid Profile:  Recent Labs  09/01/15 0525  CHOL 82  HDL 13*  LDLCALC 55  TRIG 71  CHOLHDL 6.3    Thyroid Function Tests: No results for input(s): TSH, T4TOTAL, FREET4, T3FREE, THYROIDAB in the last 72 hours.  Anemia Panel: No results for input(s): VITAMINB12, FOLATE, FERRITIN, TIBC, IRON, RETICCTPCT in the last 72 hours.  Urine analysis:    Component Value Date/Time   COLORURINE ORANGE (A) 08/31/2015 1725   APPEARANCEUR CLEAR 08/31/2015 1725   LABSPEC 1.029 08/31/2015 1725  PHURINE 6.0 08/31/2015 1725   GLUCOSEU 100 (A) 08/31/2015 1725   HGBUR NEGATIVE 08/31/2015 1725   BILIRUBINUR SMALL (A) 08/31/2015 1725   KETONESUR 15 (A) 08/31/2015 1725   PROTEINUR 100 (A) 08/31/2015 1725   NITRITE NEGATIVE 08/31/2015 1725   LEUKOCYTESUR TRACE (A) 08/31/2015 1725    Sepsis Labs: Lactic Acid, Venous    Component Value Date/Time   LATICACIDVEN 1.0 09/01/2015 0512    MICROBIOLOGY: Recent Results (from the past 240 hour(s))  Culture, blood (routine x 2)     Status: None (Preliminary result)   Collection Time: 08/31/15  6:40 PM  Result Value Ref Range Status   Specimen Description BLOOD LEFT ANTECUBITAL  Final   Special Requests BOTTLES DRAWN AEROBIC AND ANAEROBIC 5CC  Final   Culture PENDING  Incomplete   Report Status PENDING  Incomplete  Culture, blood (routine x 2)     Status: None (Preliminary result)   Collection Time: 08/31/15  7:00 PM  Result Value Ref Range Status   Specimen Description BLOOD RIGHT HAND  Final   Special Requests BOTTLES DRAWN AEROBIC ONLY 3CC  Final   Culture PENDING  Incomplete   Report Status PENDING  Incomplete  Blood culture (routine x 2)     Status: None (Preliminary result)   Collection Time: 08/31/15 10:15 PM    Result Value Ref Range Status   Specimen Description BLOOD RIGHT ANTECUBITAL  Final   Special Requests BOTTLES DRAWN AEROBIC AND ANAEROBIC 5CC  Final   Culture PENDING  Incomplete   Report Status PENDING  Incomplete  Blood culture (routine x 2)     Status: None (Preliminary result)   Collection Time: 08/31/15 10:34 PM  Result Value Ref Range Status   Specimen Description BLOOD RIGHT WRIST  Final   Special Requests BOTTLES DRAWN AEROBIC AND ANAEROBIC 5CC  Final   Culture PENDING  Incomplete   Report Status PENDING  Incomplete  MRSA PCR Screening     Status: None   Collection Time: 09/01/15  2:01 AM  Result Value Ref Range Status   MRSA by PCR NEGATIVE NEGATIVE Final    Comment:        The GeneXpert MRSA Assay (FDA approved for NASAL specimens only), is one component of a comprehensive MRSA colonization surveillance program. It is not intended to diagnose MRSA infection nor to guide or monitor treatment for MRSA infections.     RADIOLOGY STUDIES/RESULTS: Dg Chest 2 View  Result Date: 08/31/2015 CLINICAL DATA:  Right-sided chest pain. EXAM: CHEST  2 VIEW COMPARISON:  None. FINDINGS: Mild right upper lobe airspace opacity. No other evidence of focal consolidation. No pulmonary edema. Normal cardiomediastinal contours. No pleural effusion or pneumothorax. IMPRESSION: Mild right upper lobe airspace opacity could indicate developing infection. Otherwise normal chest radiograph. Electronically Signed   By: Deatra Robinson M.D.   On: 08/31/2015 19:16   Ct Head Wo Contrast  Result Date: 08/31/2015 CLINICAL DATA:  Patient was drinking and doing drugs last night and now complains of chest pain radiating to the mid abdomen. Headache. EXAM: CT HEAD WITHOUT CONTRAST TECHNIQUE: Contiguous axial images were obtained from the base of the skull through the vertex without intravenous contrast. COMPARISON:  06/21/2008 FINDINGS: Brain: Examination technically limited due to motion artifact. Ventricles  and sulci appear symmetrical. No ventricular dilatation. No mass effect or midline shift. No abnormal extra-axial fluid collections. Gray-white matter junctions are distinct. Basal cisterns are not effaced. No evidence of acute intracranial hemorrhage. Vascular: No hyperdense vessel or unexpected  calcification. Skull: No depressed skull fractures. Sinuses/Orbits: No acute finding. Other: No significant changes since previous study. IMPRESSION: No acute intracranial abnormalities. Electronically Signed   By: Burman Nieves M.D.   On: 08/31/2015 21:22   Ct Angio Chest Aorta W/cm &/or Wo/cm  Result Date: 08/31/2015 CLINICAL DATA:  Chest pain radiating to the abdomen. History of IV drug use. EXAM: CT ANGIOGRAPHY CHEST, ABDOMEN AND PELVIS TECHNIQUE: Multidetector CT imaging through the chest, abdomen and pelvis was performed using the standard protocol during bolus administration of intravenous contrast. Multiplanar reconstructed images and MIPs were obtained and reviewed to evaluate the vascular anatomy. CONTRAST:  100 cc Isovue 370 IV COMPARISON:  Chest radiograph earlier this day. FINDINGS: CTA CHEST FINDINGS Cardiovascular: No aortic dissection, aneurysm, hematoma, or acute aortic syndrome. Thoracic aorta is normal in caliber. Heart is normal in size. No filling defects in the central pulmonary arteries. Mediastinum/nodes: Fluid abutting the right mediastinal border measuring up to 3.4 cm may be a loculated pleural effusion versus less likely pericardial effusion. This is heterogeneous fluid density with minimal peripheral but no internal enhancement. Enlarged right hilar node measures 14 mm. Small prevascular nodes. There is a prominent subcarinal node. Probable epicardial node. Lungs/pleura: Multifocal pulmonary nodules in a pattern consistent with septic emboli/infection. These are bilateral and in falls all lobes of both lungs. Greatest burden is in the upper lobes were there is probable early cavitation.  Nodule conglomerate in the upper lobe measures 3.4 x 1.9 cm. Within the medial right lung base his consolidation with central air, suspicious for necrotizing infection/ pneumonia. There is a partially loculated right pleural effusion measuring up to 2 cm, some peripheral enhancement. Trace left pleural effusion. Musculoskeletal: There are no acute or suspicious osseous abnormalities. Review of the MIP images confirms the above findings. CTA ABDOMEN AND PELVIS FINDINGS Aorta/vascular: Abdominal aorta is normal in caliber. There is no dissection, aneurysm, or acute aortic syndrome. Normal variant anatomy with small likely a accessory hepatic Ing gastric artery arising directly from the aorta. Celiac, superior mesenteric, and inferior mesenteric arteries are patent. Single bilateral renal arteries are patent. Normal caliber bilateral common and external iliac arteries. Liver: Normal for arterial phase imaging. Hepatobiliary: Gallbladder physiologically distended, no calcified stone. No biliary dilatation. Pancreas: No ductal dilatation or inflammation. Spleen: Normal for arterial phase imaging. Adrenal glands: No nodule. Kidneys: Symmetric renal enhancement. No hydronephrosis. No perinephric edema. Stomach/Bowel: Stomach physiologically distended. There are no dilated or thickened small bowel loops. Small volume of stool throughout the colon without colonic wall thickening. The appendix is normal. Vascular/Lymphatic: No retroperitoneal, mesenteric, or pelvic adenopathy. Reproductive: No acute abnormality. Bladder: Physiologically distended without wall thickening. Other: No free air, free fluid, or intra-abdominal fluid collection. Musculoskeletal: There are no acute or suspicious osseous abnormalities. Review of the MIP images confirms the above findings. IMPRESSION: 1. Findings consistent with bilateral septic emboli involving all lobes of both lungs. There is evidence of central necrosis involving right upper and  lower lobe lesions. Partially loculated right pleural effusion. Fluid adjacent of right heart border is likely loculated pleural fluid versus less likely pericardial fluid. Mediastinal and right hilar adenopathy is likely reactive. 2. No aortic dissection. 3. No acute abnormality in the abdomen/pelvis. These results were called by telephone at the time of interpretation on 08/31/2015 at 10:04 pm to Dr. Arby Barrette , who verbally acknowledged these results. Electronically Signed   By: Rubye Oaks M.D.   On: 08/31/2015 22:05   Ct Angio Abd/pel W/ And/or W/o  Result Date: 08/31/2015 CLINICAL DATA:  Chest pain radiating to the abdomen. History of IV drug use. EXAM: CT ANGIOGRAPHY CHEST, ABDOMEN AND PELVIS TECHNIQUE: Multidetector CT imaging through the chest, abdomen and pelvis was performed using the standard protocol during bolus administration of intravenous contrast. Multiplanar reconstructed images and MIPs were obtained and reviewed to evaluate the vascular anatomy. CONTRAST:  100 cc Isovue 370 IV COMPARISON:  Chest radiograph earlier this day. FINDINGS: CTA CHEST FINDINGS Cardiovascular: No aortic dissection, aneurysm, hematoma, or acute aortic syndrome. Thoracic aorta is normal in caliber. Heart is normal in size. No filling defects in the central pulmonary arteries. Mediastinum/nodes: Fluid abutting the right mediastinal border measuring up to 3.4 cm may be a loculated pleural effusion versus less likely pericardial effusion. This is heterogeneous fluid density with minimal peripheral but no internal enhancement. Enlarged right hilar node measures 14 mm. Small prevascular nodes. There is a prominent subcarinal node. Probable epicardial node. Lungs/pleura: Multifocal pulmonary nodules in a pattern consistent with septic emboli/infection. These are bilateral and in falls all lobes of both lungs. Greatest burden is in the upper lobes were there is probable early cavitation. Nodule conglomerate in the  upper lobe measures 3.4 x 1.9 cm. Within the medial right lung base his consolidation with central air, suspicious for necrotizing infection/ pneumonia. There is a partially loculated right pleural effusion measuring up to 2 cm, some peripheral enhancement. Trace left pleural effusion. Musculoskeletal: There are no acute or suspicious osseous abnormalities. Review of the MIP images confirms the above findings. CTA ABDOMEN AND PELVIS FINDINGS Aorta/vascular: Abdominal aorta is normal in caliber. There is no dissection, aneurysm, or acute aortic syndrome. Normal variant anatomy with small likely a accessory hepatic Ing gastric artery arising directly from the aorta. Celiac, superior mesenteric, and inferior mesenteric arteries are patent. Single bilateral renal arteries are patent. Normal caliber bilateral common and external iliac arteries. Liver: Normal for arterial phase imaging. Hepatobiliary: Gallbladder physiologically distended, no calcified stone. No biliary dilatation. Pancreas: No ductal dilatation or inflammation. Spleen: Normal for arterial phase imaging. Adrenal glands: No nodule. Kidneys: Symmetric renal enhancement. No hydronephrosis. No perinephric edema. Stomach/Bowel: Stomach physiologically distended. There are no dilated or thickened small bowel loops. Small volume of stool throughout the colon without colonic wall thickening. The appendix is normal. Vascular/Lymphatic: No retroperitoneal, mesenteric, or pelvic adenopathy. Reproductive: No acute abnormality. Bladder: Physiologically distended without wall thickening. Other: No free air, free fluid, or intra-abdominal fluid collection. Musculoskeletal: There are no acute or suspicious osseous abnormalities. Review of the MIP images confirms the above findings. IMPRESSION: 1. Findings consistent with bilateral septic emboli involving all lobes of both lungs. There is evidence of central necrosis involving right upper and lower lobe lesions. Partially  loculated right pleural effusion. Fluid adjacent of right heart border is likely loculated pleural fluid versus less likely pericardial fluid. Mediastinal and right hilar adenopathy is likely reactive. 2. No aortic dissection. 3. No acute abnormality in the abdomen/pelvis. These results were called by telephone at the time of interpretation on 08/31/2015 at 10:04 pm to Dr. Arby BarretteMARCY PFEIFFER , who verbally acknowledged these results. Electronically Signed   By: Rubye OaksMelanie  Ehinger M.D.   On: 08/31/2015 22:05     LOS: 0 days   Jeoffrey MassedGHIMIRE,Madhavi Hamblen, MD  [facility] Pager:[phone]  If 7PM-7AM, please contact night-coverage [email or web address] Password [email or web address] 09/01/2015, 2:08 PM

## 2015-09-01 NOTE — ED Notes (Signed)
[facility] here to transport patient. 

## 2015-09-01 NOTE — ED Notes (Signed)
[facility] RN on 3W

## 2015-09-01 NOTE — H&P (Signed)
History and Physical    Kenneth Hackerustin Mao ZOX:[id number] DOB: [birth year] DOA: 08/31/2015  Referring MD/NP/PA:   PCP: No PCP Per Patient   Patient coming from:  The patient is coming from home.  At baseline, pt is independent for most of ADL.      Chief Complaint: Chest pain, fever, chills, generalized weakness, nausea, vomiting.  HPI: Kenneth Davis is a 25 y.o. male with medical history significant of IV drug use, cocaine abuse, tobacco abuse, alcohol abuse, marijuana abuse, who presents with fever, chills, weakness, chest pain, nausea, vomiting.   Per report, pt drank alcohol and smoked "pot" 6 days ago, then started having fever, chills, weakness, chest pain, headache, nausea, vomiting. Pt vomited once on Monday and Wednesday, then did not have nausea, vomiting again. Patient does not have abdominal pain or diarrhea currently. His chest pain is located in the front chest, constant, 10 out of 10, nonradiating, pleuritic. It is aggravated by deep breath and coughing. He has mild dry cough and SOB. He had headache earlier, which has completely resolved. Patient does not have unilateral weakness, vision change or hearing loss.  ED Course: pt was found to have bilateral septic emboli involving all lobes of both lungs by CTA of chest. There is evidence of central necrosis involving right upper and lower lobe lesions. Partially loculated right pleural effusion, possible loculated pleural fluid versus less likely pericardial fluid. Troponin 0.29, positive UDS for cocaine and THS, alcohol level less than 5, negative rapid strep, urinalysis with trace amount of leukocyte, lactate 1.76, temperature 102.8, tachycardia, tachypnea, oxygen saturation 78% on room air, blood pressure 98/67, electrolytes and renal function okay. CT-head is negative for acute intracranial abnormalities. CTA of abdomen/pelvis is not impressive. Patient is placed on stepdown bed as inpatient. IV rocephin and vancomycin were given in  ED.  Review of Systems:   General: has fevers, chills, no changes in body weight, has poor appetite, has fatigue HEENT: no blurry vision, hearing changes or sore throat Respiratory: has dyspnea, coughing, no wheezing CV: no chest pain, no palpitations GI: had nausea, vomiting, no abdominal pain, diarrhea, constipation GU: no dysuria, burning on urination, increased urinary frequency, hematuria  Ext: no leg edema Neuro: no unilateral weakness, numbness, or tingling, no vision change or hearing loss Skin: no rash MSK: No muscle spasm, no deformity, no limitation of range of movement in spin Heme: No easy bruising.  Travel history: No recent long distant travel.  Allergy: No Known Allergies  Past Medical History:  Diagnosis Date  . Alcohol abuse   . Cocaine abuse   . GERD (gastroesophageal reflux disease)   . IVDU (intravenous drug user)   . Marijuana abuse   . Tobacco abuse     Past Surgical History:  Procedure Laterality Date  . TYMPANOSTOMY TUBE PLACEMENT      Social History:  reports that he has been smoking.  He has never used smokeless tobacco. He reports that he drinks alcohol. He reports that he uses drugs, including Marijuana.  Family History: Reviewed with patient, but the patient does not know any family medical history.  Prior to Admission medications   Medication Sig Start Date End Date Taking? Authorizing Provider  doxycycline (DORYX) 100 MG EC tablet Take 100 mg by mouth 2 (two) times daily.      Historical Provider, MD  famotidine (PEPCID) 20 MG tablet Take 1 tablet (20 mg total) by mouth 2 (two) times daily. 10/27/10 10/27/11  Linwood DibblesJon Knapp, MD    Physical Exam:  Vitals:   09/01/15 0104 09/01/15 0200 09/01/15 0310 09/01/15 0500  BP: 95/59 116/61 104/70 112/67  Pulse:  85  85  Resp: 20  (!) 24 (!) 26  Temp:  99.7 F (37.6 C)  100.3 F (37.9 C)  TempSrc:  Oral  Oral  SpO2:  99%  98%  Weight:  63.5 kg (140 lb)    Height:  5\' 10"  (1.778 m)     General:  Not in acute distress HEENT:       Eyes: PERRL, EOMI, no scleral icterus.       ENT: No discharge from the ears and nose, no pharynx injection, no tonsillar enlargement.        Neck: No JVD, no bruit, no mass felt. Heme: No neck lymph node enlargement. Cardiac: S1/S2, RRR, No murmurs, No gallops or rubs. Respiratory: No rales, wheezing, rhonchi or rubs. GI: Soft, nondistended, nontender, no rebound pain, no organomegaly, BS present. GU: No hematuria Ext: No pitting leg edema bilaterally. 2+DP/PT pulse bilaterally. Musculoskeletal: No joint deformities, No joint redness or warmth, no limitation of ROM in spin. Skin: No rashes.  Neuro: Alert, oriented X3, cranial nerves II-XII grossly intact, moves all extremities normally. Psych: Patient is not psychotic, no suicidal or hemocidal ideation.  Labs on Admission: I have personally reviewed following labs and imaging studies  CBC:  Recent Labs Lab 08/31/15 1900  WBC 13.2*  NEUTROABS 10.2*  HGB 17.2*  HCT 48.2  MCV 88.8  PLT 155   Basic Metabolic Panel:  Recent Labs Lab 08/31/15 1900  NA 131*  K 3.9  CL 94*  CO2 25  GLUCOSE 116*  BUN 16  CREATININE 0.96  CALCIUM 8.7*   GFR: Estimated Creatinine Clearance: 107.5 mL/min (by C-G formula based on SCr of 0.96 mg/dL). Liver Function Tests:  Recent Labs Lab 08/31/15 1900  AST 32  ALT 23  ALKPHOS 61  BILITOT 1.9*  PROT 8.0  ALBUMIN 3.2*    Recent Labs Lab 08/31/15 1900  LIPASE 38   No results for input(s): AMMONIA in the last 168 hours. Coagulation Profile:  Recent Labs Lab 08/31/15 1900  INR 1.10   Cardiac Enzymes:  Recent Labs Lab 08/31/15 1900  TROPONINI 0.29*   BNP (last 3 results) No results for input(s): PROBNP in the last 8760 hours. HbA1C: No results for input(s): HGBA1C in the last 72 hours. CBG:  Recent Labs Lab 08/31/15 1722  GLUCAP 203*   Lipid Profile:  Recent Labs  09/01/15 0525  CHOL 82  HDL 13*  LDLCALC 55  TRIG 71   CHOLHDL 6.3   Thyroid Function Tests: No results for input(s): TSH, T4TOTAL, FREET4, T3FREE, THYROIDAB in the last 72 hours. Anemia Panel: No results for input(s): VITAMINB12, FOLATE, FERRITIN, TIBC, IRON, RETICCTPCT in the last 72 hours. Urine analysis:    Component Value Date/Time   COLORURINE ORANGE (A) 08/31/2015 1725   APPEARANCEUR CLEAR 08/31/2015 1725   LABSPEC 1.029 08/31/2015 1725   PHURINE 6.0 08/31/2015 1725   GLUCOSEU 100 (A) 08/31/2015 1725   HGBUR NEGATIVE 08/31/2015 1725   BILIRUBINUR SMALL (A) 08/31/2015 1725   KETONESUR 15 (A) 08/31/2015 1725   PROTEINUR 100 (A) 08/31/2015 1725   NITRITE NEGATIVE 08/31/2015 1725   LEUKOCYTESUR TRACE (A) 08/31/2015 1725   Sepsis Labs: @LABRCNTIP (procalcitonin:4,lacticidven:4) ) Recent Results (from the past 240 hour(s))  Culture, blood (routine x 2)     Status: None (Preliminary result)   Collection Time: 08/31/15  6:40 PM  Result Value Ref Range  Status   Specimen Description BLOOD LEFT ANTECUBITAL  Final   Special Requests BOTTLES DRAWN AEROBIC AND ANAEROBIC 5CC  Final   Culture PENDING  Incomplete   Report Status PENDING  Incomplete  Culture, blood (routine x 2)     Status: None (Preliminary result)   Collection Time: 08/31/15  7:00 PM  Result Value Ref Range Status   Specimen Description BLOOD RIGHT HAND  Final   Special Requests BOTTLES DRAWN AEROBIC ONLY 3CC  Final   Culture PENDING  Incomplete   Report Status PENDING  Incomplete  Blood culture (routine x 2)     Status: None (Preliminary result)   Collection Time: 08/31/15 10:15 PM  Result Value Ref Range Status   Specimen Description BLOOD RIGHT ANTECUBITAL  Final   Special Requests BOTTLES DRAWN AEROBIC AND ANAEROBIC 5CC  Final   Culture PENDING  Incomplete   Report Status PENDING  Incomplete  Blood culture (routine x 2)     Status: None (Preliminary result)   Collection Time: 08/31/15 10:34 PM  Result Value Ref Range Status   Specimen Description BLOOD RIGHT  WRIST  Final   Special Requests BOTTLES DRAWN AEROBIC AND ANAEROBIC 5CC  Final   Culture PENDING  Incomplete   Report Status PENDING  Incomplete  MRSA PCR Screening     Status: None   Collection Time: 09/01/15  2:01 AM  Result Value Ref Range Status   MRSA by PCR NEGATIVE NEGATIVE Final    Comment:        The GeneXpert MRSA Assay (FDA approved for NASAL specimens only), is one component of a comprehensive MRSA colonization surveillance program. It is not intended to diagnose MRSA infection nor to guide or monitor treatment for MRSA infections.      Radiological Exams on Admission: Dg Chest 2 View  Result Date: 08/31/2015 CLINICAL DATA:  Right-sided chest pain. EXAM: CHEST  2 VIEW COMPARISON:  None. FINDINGS: Mild right upper lobe airspace opacity. No other evidence of focal consolidation. No pulmonary edema. Normal cardiomediastinal contours. No pleural effusion or pneumothorax. IMPRESSION: Mild right upper lobe airspace opacity could indicate developing infection. Otherwise normal chest radiograph. Electronically Signed   By: Deatra Robinson M.D.   On: 08/31/2015 19:16   Ct Head Wo Contrast  Result Date: 08/31/2015 CLINICAL DATA:  Patient was drinking and doing drugs last night and now complains of chest pain radiating to the mid abdomen. Headache. EXAM: CT HEAD WITHOUT CONTRAST TECHNIQUE: Contiguous axial images were obtained from the base of the skull through the vertex without intravenous contrast. COMPARISON:  06/21/2008 FINDINGS: Brain: Examination technically limited due to motion artifact. Ventricles and sulci appear symmetrical. No ventricular dilatation. No mass effect or midline shift. No abnormal extra-axial fluid collections. Gray-white matter junctions are distinct. Basal cisterns are not effaced. No evidence of acute intracranial hemorrhage. Vascular: No hyperdense vessel or unexpected calcification. Skull: No depressed skull fractures. Sinuses/Orbits: No acute finding.  Other: No significant changes since previous study. IMPRESSION: No acute intracranial abnormalities. Electronically Signed   By: Burman Nieves M.D.   On: 08/31/2015 21:22   Ct Angio Chest Aorta W/cm &/or Wo/cm  Result Date: 08/31/2015 CLINICAL DATA:  Chest pain radiating to the abdomen. History of IV drug use. EXAM: CT ANGIOGRAPHY CHEST, ABDOMEN AND PELVIS TECHNIQUE: Multidetector CT imaging through the chest, abdomen and pelvis was performed using the standard protocol during bolus administration of intravenous contrast. Multiplanar reconstructed images and MIPs were obtained and reviewed to evaluate the vascular anatomy. CONTRAST:  100 cc Isovue 370 IV COMPARISON:  Chest radiograph earlier this day. FINDINGS: CTA CHEST FINDINGS Cardiovascular: No aortic dissection, aneurysm, hematoma, or acute aortic syndrome. Thoracic aorta is normal in caliber. Heart is normal in size. No filling defects in the central pulmonary arteries. Mediastinum/nodes: Fluid abutting the right mediastinal border measuring up to 3.4 cm may be a loculated pleural effusion versus less likely pericardial effusion. This is heterogeneous fluid density with minimal peripheral but no internal enhancement. Enlarged right hilar node measures 14 mm. Small prevascular nodes. There is a prominent subcarinal node. Probable epicardial node. Lungs/pleura: Multifocal pulmonary nodules in a pattern consistent with septic emboli/infection. These are bilateral and in falls all lobes of both lungs. Greatest burden is in the upper lobes were there is probable early cavitation. Nodule conglomerate in the upper lobe measures 3.4 x 1.9 cm. Within the medial right lung base his consolidation with central air, suspicious for necrotizing infection/ pneumonia. There is a partially loculated right pleural effusion measuring up to 2 cm, some peripheral enhancement. Trace left pleural effusion. Musculoskeletal: There are no acute or suspicious osseous  abnormalities. Review of the MIP images confirms the above findings. CTA ABDOMEN AND PELVIS FINDINGS Aorta/vascular: Abdominal aorta is normal in caliber. There is no dissection, aneurysm, or acute aortic syndrome. Normal variant anatomy with small likely a accessory hepatic Ing gastric artery arising directly from the aorta. Celiac, superior mesenteric, and inferior mesenteric arteries are patent. Single bilateral renal arteries are patent. Normal caliber bilateral common and external iliac arteries. Liver: Normal for arterial phase imaging. Hepatobiliary: Gallbladder physiologically distended, no calcified stone. No biliary dilatation. Pancreas: No ductal dilatation or inflammation. Spleen: Normal for arterial phase imaging. Adrenal glands: No nodule. Kidneys: Symmetric renal enhancement. No hydronephrosis. No perinephric edema. Stomach/Bowel: Stomach physiologically distended. There are no dilated or thickened small bowel loops. Small volume of stool throughout the colon without colonic wall thickening. The appendix is normal. Vascular/Lymphatic: No retroperitoneal, mesenteric, or pelvic adenopathy. Reproductive: No acute abnormality. Bladder: Physiologically distended without wall thickening. Other: No free air, free fluid, or intra-abdominal fluid collection. Musculoskeletal: There are no acute or suspicious osseous abnormalities. Review of the MIP images confirms the above findings. IMPRESSION: 1. Findings consistent with bilateral septic emboli involving all lobes of both lungs. There is evidence of central necrosis involving right upper and lower lobe lesions. Partially loculated right pleural effusion. Fluid adjacent of right heart border is likely loculated pleural fluid versus less likely pericardial fluid. Mediastinal and right hilar adenopathy is likely reactive. 2. No aortic dissection. 3. No acute abnormality in the abdomen/pelvis. These results were called by telephone at the time of interpretation  on 08/31/2015 at 10:04 pm to Dr. Arby Barrette , who verbally acknowledged these results. Electronically Signed   By: Rubye Oaks M.D.   On: 08/31/2015 22:05   Ct Angio Abd/pel W/ And/or W/o  Result Date: 08/31/2015 CLINICAL DATA:  Chest pain radiating to the abdomen. History of IV drug use. EXAM: CT ANGIOGRAPHY CHEST, ABDOMEN AND PELVIS TECHNIQUE: Multidetector CT imaging through the chest, abdomen and pelvis was performed using the standard protocol during bolus administration of intravenous contrast. Multiplanar reconstructed images and MIPs were obtained and reviewed to evaluate the vascular anatomy. CONTRAST:  100 cc Isovue 370 IV COMPARISON:  Chest radiograph earlier this day. FINDINGS: CTA CHEST FINDINGS Cardiovascular: No aortic dissection, aneurysm, hematoma, or acute aortic syndrome. Thoracic aorta is normal in caliber. Heart is normal in size. No filling defects in the central pulmonary arteries. Mediastinum/nodes:  Fluid abutting the right mediastinal border measuring up to 3.4 cm may be a loculated pleural effusion versus less likely pericardial effusion. This is heterogeneous fluid density with minimal peripheral but no internal enhancement. Enlarged right hilar node measures 14 mm. Small prevascular nodes. There is a prominent subcarinal node. Probable epicardial node. Lungs/pleura: Multifocal pulmonary nodules in a pattern consistent with septic emboli/infection. These are bilateral and in falls all lobes of both lungs. Greatest burden is in the upper lobes were there is probable early cavitation. Nodule conglomerate in the upper lobe measures 3.4 x 1.9 cm. Within the medial right lung base his consolidation with central air, suspicious for necrotizing infection/ pneumonia. There is a partially loculated right pleural effusion measuring up to 2 cm, some peripheral enhancement. Trace left pleural effusion. Musculoskeletal: There are no acute or suspicious osseous abnormalities. Review of the  MIP images confirms the above findings. CTA ABDOMEN AND PELVIS FINDINGS Aorta/vascular: Abdominal aorta is normal in caliber. There is no dissection, aneurysm, or acute aortic syndrome. Normal variant anatomy with small likely a accessory hepatic Ing gastric artery arising directly from the aorta. Celiac, superior mesenteric, and inferior mesenteric arteries are patent. Single bilateral renal arteries are patent. Normal caliber bilateral common and external iliac arteries. Liver: Normal for arterial phase imaging. Hepatobiliary: Gallbladder physiologically distended, no calcified stone. No biliary dilatation. Pancreas: No ductal dilatation or inflammation. Spleen: Normal for arterial phase imaging. Adrenal glands: No nodule. Kidneys: Symmetric renal enhancement. No hydronephrosis. No perinephric edema. Stomach/Bowel: Stomach physiologically distended. There are no dilated or thickened small bowel loops. Small volume of stool throughout the colon without colonic wall thickening. The appendix is normal. Vascular/Lymphatic: No retroperitoneal, mesenteric, or pelvic adenopathy. Reproductive: No acute abnormality. Bladder: Physiologically distended without wall thickening. Other: No free air, free fluid, or intra-abdominal fluid collection. Musculoskeletal: There are no acute or suspicious osseous abnormalities. Review of the MIP images confirms the above findings. IMPRESSION: 1. Findings consistent with bilateral septic emboli involving all lobes of both lungs. There is evidence of central necrosis involving right upper and lower lobe lesions. Partially loculated right pleural effusion. Fluid adjacent of right heart border is likely loculated pleural fluid versus less likely pericardial fluid. Mediastinal and right hilar adenopathy is likely reactive. 2. No aortic dissection. 3. No acute abnormality in the abdomen/pelvis. These results were called by telephone at the time of interpretation on 08/31/2015 at 10:04 pm to  Dr. Arby Barrette , who verbally acknowledged these results. Electronically Signed   By: Rubye Oaks M.D.   On: 08/31/2015 22:05     EKG: Independently reviewed. Sinus rhythm, QTC 400, mild T-wave inversion only in lead 2, poor R-wave progression  Assessment/Plan Principal Problem:   Septic pulmonary embolism (HCC) Active Problems:   Cocaine abuse   Marijuana abuse   Tobacco abuse   IVDU (intravenous drug user)   Sepsis (HCC)   Chest pain   Elevated troponin   Alcohol abuse   GERD (gastroesophageal reflux disease)   Septic pulmonary embolism and sepsis: As evidenced by CTA chest. This is due to IV drug use. Patient meets criteria for sepsis with leukocytosis, fever, tachycardia, tachypnea. Lactic is normal. Currently hemodynamically stable.  -will admit to SDU as inpt. -IV cefepime and vancomycin (patient received 1 dose of Rocephin, which is switched to cefepime for Pseudomonas coverage) -Follow-up blood culture and a urine culture -will get Procalcitonin and trend lactic acid levels per sepsis protocol. -IVF: 2L of NS bolus in ED, followed by 125 cc/h  -  2D echo for possible endocarditis  Chest pain and elevated troponin: Troponin 0.29. Most likely due to demanding ischemia secondary to sepsis, but the patient is positive for cocaine, therefore ACS is also possible. - cycle CE q6 x3 and repeat her EKG in the am  - Nitroglycerin, oxycodone, and aspirin - Risk factor stratification: will check FLP and A1C  - 2d echo  GERD: -Pepcid IV  Polysubstance abuse: Including tobacco, alcohol, marijuana, cocaine. -Did counseling about importance of quitting substance -Nicotine patch -CIWA protocol -Consult social work  DVT ppx: SCD Code Status: Full code Family Communication: None at bed side. Disposition Plan:  Anticipate discharge back to previous home environment Consults called:  none Admission status: SDU/inpation       Date of Service 09/01/2015    Lorretta Harp [facility] Pager [phone]  If 7PM-7AM, please contact night-coverage [email or web address] Password [email or web address] 09/01/2015, 6:23 AM

## 2015-09-01 NOTE — Consult Note (Signed)
[name]  Date of Admission:  08/31/2015  Date of Consult:  09/01/2015  Reason for Consult: Septic Pulmonary Emboli, IVDA  Referring Physician: Sloan Leiter  Impression/Recommendation Septic Pulmonary Emboli Await BCx Continue broad anbx Check TTE, possible TEE depending on Cx  IVDA Check Hep panel Check HIV Case Management eval for rehab options.  Watch for signs of withdrawal.   Thank you so much for this interesting consult,   [name] (pager) [phone] [email or web address]  Kenneth Davis is an 25 y.o. male.  HPI: 25 yo M with recent binge (ETOH, Marijuana, xanax) and waking up in his room with EMS and no recollection of previous 5 days. He also has a hx of prev IV cocaine use.  He complained of weakness and chills, as well as R sided pain.  In ED he was found to have temp 102.8, procalcitonin 5.41 and WBC 13.2, proteinuria, UDS+ cocaine and THC.  He also had CT showing: Findings consistent with bilateral septic emboli involving all lobes of both lungs. There is evidence of central necrosis involving right upper and lower lobe lesions. Partially loculated right pleural effusion. Fluid adjacent of right heart border is likely loculated pleural fluid versus less likely pericardial fluid. Mediastinal and right hilar adenopathy is likely reactive.  He had 2 positive troponins (0.06).  Has no hx of TB exposure, was prev incarcerated 2015 (ppd- then), not prev homeless No prev rehab, detox, or NA  Past Medical History:  Diagnosis Date  . Alcohol abuse   . Cocaine abuse   . GERD (gastroesophageal reflux disease)   . IVDU (intravenous drug user)   . Marijuana abuse   . Tobacco abuse     Past Surgical History:  Procedure Laterality Date  . TYMPANOSTOMY TUBE PLACEMENT       No Known Allergies  Medications:  Scheduled: . ceFEPime (MAXIPIME) IV  2 g Intravenous Q8H  . enoxaparin (LOVENOX) injection  40 mg Subcutaneous Q24H  .  famotidine  20 mg Oral BID  . folic acid  1 mg Oral Daily  . LORazepam  0-4 mg Intravenous Q6H   Followed by  . [START ON 09/03/2015] LORazepam  0-4 mg Intravenous Q12H  . multivitamin with minerals  1 tablet Oral Daily  . nicotine  21 mg Transdermal Daily  . thiamine  100 mg Oral Daily   Or  . thiamine  100 mg Intravenous Daily  . vancomycin  1,000 mg Intravenous Q8H    Abtx:  Anti-infectives    Start     Dose/Rate Route Frequency Ordered Stop   09/01/15 0600  ceFEPIme (MAXIPIME) 2 g in dextrose 5 % 50 mL IVPB     2 g 100 mL/hr over 30 Minutes Intravenous Every 8 hours 09/01/15 0252     09/01/15 0600  vancomycin (VANCOCIN) IVPB 1000 mg/200 mL premix     1,000 mg 200 mL/hr over 60 Minutes Intravenous Every 8 hours 09/01/15 0304     08/31/15 2200  vancomycin (VANCOCIN) IVPB 1000 mg/200 mL premix     1,000 mg 200 mL/hr over 60 Minutes Intravenous  Once 08/31/15 2158 09/01/15 0001   08/31/15 2200  cefTRIAXone (ROCEPHIN) 2 g in dextrose 5 % 50 mL IVPB     2 g 100 mL/hr over 30 Minutes Intravenous  Once 08/31/15 2158 09/01/15 0031      Total days of antibiotics: 0 vanco/cefepime          Social History:  reports that he has  been smoking.  He has never used smokeless tobacco. He reports that he drinks alcohol. He reports that he uses drugs, including Marijuana.  No family history on file.  Does not speak to mom, dad has health issues (does not go to doctor).   General ROS: denies f/c, no BM since 8-22, was only up 1 hr /day last week, had cough, green sputum on one day.  denies vision change. Denies LAN.  Please see HPI. 12 point ROS o/w (-)  Blood pressure 114/70, pulse 97, temperature 98.1 F (36.7 C), temperature source Axillary, resp. rate (!) 24, height _0  (1.778 m), weight 63.5 kg (140 lb), SpO2 98 %. General appearance: alert and no distress Eyes: negative findings: conjunctivae and sclerae normal and pupils equal, round, reactive to light and accomodation Throat:  abnormal findings: dentition: multiple carries Lungs: rhonchi bilaterally Heart: regular rate and rhythm Abdomen: normal findings: bowel sounds normal and soft, non-tender Extremities: edema none   Results for orders placed or performed during the hospital encounter of 08/31/15 (from the past 48 hour(s))  POC CBG, ED     Status: Abnormal   Collection Time: 08/31/15  5:22 PM  Result Value Ref Range   Glucose-Capillary 203 (H) 65 - 99 mg/dL  Urinalysis, Routine w reflex microscopic (not at [facility])     Status: Abnormal   Collection Time: 08/31/15  5:25 PM  Result Value Ref Range   Color, Urine ORANGE (A) YELLOW    Comment: BIOCHEMICALS MAY BE AFFECTED BY COLOR   APPearance CLEAR CLEAR   Specific Gravity, Urine 1.029 1.005 - 1.030   pH 6.0 5.0 - 8.0   Glucose, UA 100 (A) NEGATIVE mg/dL   Hgb urine dipstick NEGATIVE NEGATIVE   Bilirubin Urine SMALL (A) NEGATIVE   Ketones, ur 15 (A) NEGATIVE mg/dL   Protein, ur 100 (A) NEGATIVE mg/dL   Nitrite NEGATIVE NEGATIVE   Leukocytes, UA TRACE (A) NEGATIVE  Urine microscopic-add on     Status: Abnormal   Collection Time: 08/31/15  5:25 PM  Result Value Ref Range   Squamous Epithelial / LPF 0-5 (A) NONE SEEN   WBC, UA 0-5 0 - 5 WBC/hpf   RBC / HPF 0-5 0 - 5 RBC/hpf   Bacteria, UA FEW (A) NONE SEEN   Urine-Other MUCOUS PRESENT   Urine rapid drug screen (hosp performed)     Status: Abnormal   Collection Time: 08/31/15  5:25 PM  Result Value Ref Range   Opiates NONE DETECTED NONE DETECTED   Cocaine POSITIVE (A) NONE DETECTED   Benzodiazepines NONE DETECTED NONE DETECTED   Amphetamines NONE DETECTED NONE DETECTED   Tetrahydrocannabinol POSITIVE (A) NONE DETECTED   Barbiturates NONE DETECTED NONE DETECTED    Comment:        DRUG SCREEN FOR MEDICAL PURPOSES ONLY.  IF CONFIRMATION IS NEEDED FOR ANY PURPOSE, NOTIFY LAB WITHIN 5 DAYS.        LOWEST DETECTABLE LIMITS FOR URINE DRUG SCREEN Drug Class       Cutoff (ng/mL) Amphetamine       1000 Barbiturate      200 Benzodiazepine   630 Tricyclics       160 Opiates          300 Cocaine          300 THC              50   Culture, blood (routine x 2)     Status: None (Preliminary result)  Collection Time: 08/31/15  6:40 PM  Result Value Ref Range   Specimen Description BLOOD LEFT ANTECUBITAL    Special Requests BOTTLES DRAWN AEROBIC AND ANAEROBIC 5CC    Culture PENDING    Report Status PENDING   Comprehensive metabolic panel     Status: Abnormal   Collection Time: 08/31/15  7:00 PM  Result Value Ref Range   Sodium 131 (L) 135 - 145 mmol/L   Potassium 3.9 3.5 - 5.1 mmol/L   Chloride 94 (L) 101 - 111 mmol/L   CO2 25 22 - 32 mmol/L   Glucose, Bld 116 (H) 65 - 99 mg/dL   BUN 16 6 - 20 mg/dL   Creatinine, Ser 0.96 0.61 - 1.24 mg/dL   Calcium 8.7 (L) 8.9 - 10.3 mg/dL   Total Protein 8.0 6.5 - 8.1 g/dL   Albumin 3.2 (L) 3.5 - 5.0 g/dL   AST 32 15 - 41 U/L   ALT 23 17 - 63 U/L   Alkaline Phosphatase 61 38 - 126 U/L   Total Bilirubin 1.9 (H) 0.3 - 1.2 mg/dL   GFR calc non Af Amer >60 >60 mL/min   GFR calc Af Amer >60 >60 mL/min    Comment: (NOTE) The eGFR has been calculated using the CKD EPI equation. This calculation has not been validated in all clinical situations. eGFR's persistently <60 mL/min signify possible Chronic Kidney Disease.    Anion gap 12 5 - 15  Ethanol     Status: None   Collection Time: 08/31/15  7:00 PM  Result Value Ref Range   Alcohol, Ethyl (B) <5 <5 mg/dL    Comment:        LOWEST DETECTABLE LIMIT FOR SERUM ALCOHOL IS 5 mg/dL FOR MEDICAL PURPOSES ONLY REPEATED TO VERIFY   Lipase, blood     Status: None   Collection Time: 08/31/15  7:00 PM  Result Value Ref Range   Lipase 38 11 - 51 U/L  CBC with Differential     Status: Abnormal   Collection Time: 08/31/15  7:00 PM  Result Value Ref Range   WBC 13.2 (H) 4.0 - 10.5 K/uL   RBC 5.43 4.22 - 5.81 MIL/uL   Hemoglobin 17.2 (H) 13.0 - 17.0 g/dL   HCT 48.2 39.0 - 52.0 %   MCV 88.8 78.0  - 100.0 fL   MCH 31.7 26.0 - 34.0 pg   MCHC 35.7 30.0 - 36.0 g/dL   RDW 13.3 11.5 - 15.5 %   Platelets 155 150 - 400 K/uL   Neutrophils Relative % 65 %   Lymphocytes Relative 10 %   Monocytes Relative 13 %   Eosinophils Relative 0 %   Basophils Relative 0 %   Band Neutrophils 12 %   Neutro Abs 10.2 (H) 1.7 - 7.7 K/uL   Lymphs Abs 1.3 0.7 - 4.0 K/uL   Monocytes Absolute 1.7 (H) 0.1 - 1.0 K/uL   Eosinophils Absolute 0.0 0.0 - 0.7 K/uL   Basophils Absolute 0.0 0.0 - 0.1 K/uL   Smear Review LARGE PLATELETS PRESENT   Troponin I     Status: Abnormal   Collection Time: 08/31/15  7:00 PM  Result Value Ref Range   Troponin I 0.29 (HH) <0.03 ng/mL    Comment: CRITICAL RESULT CALLED TO, READ BACK BY AND VERIFIED WITH: BROOKS,B RN @ 1956 ON 08/31/2015 BY PUNJTAN,G   Protime-INR     Status: None   Collection Time: 08/31/15  7:00 PM  Result Value Ref Range  Prothrombin Time 14.2 11.4 - 15.2 seconds   INR 1.10   Culture, blood (routine x 2)     Status: None (Preliminary result)   Collection Time: 08/31/15  7:00 PM  Result Value Ref Range   Specimen Description BLOOD RIGHT HAND    Special Requests BOTTLES DRAWN AEROBIC ONLY 3CC    Culture PENDING    Report Status PENDING   I-Stat venous blood gas, ED     Status: Abnormal   Collection Time: 08/31/15  7:12 PM  Result Value Ref Range   pH, Ven 7.396 (H) 7.250 - 7.300   pCO2, Ven 45.3 45.0 - 50.0 mmHg   pO2, Ven 32.0 31.0 - 45.0 mmHg   Bicarbonate 27.6 (H) 20.0 - 24.0 mEq/L   TCO2 29 0 - 100 mmol/L   O2 Saturation 59.0 %   Acid-Base Excess 2.0 0.0 - 2.0 mmol/L   Patient temperature 99.7 F    Collection site IV START    Drawn by Operator    Sample type VENOUS    Comment NOTIFIED PHYSICIAN   I-Stat CG4 Lactic Acid, ED     Status: None   Collection Time: 08/31/15  7:17 PM  Result Value Ref Range   Lactic Acid, Venous 1.76 0.5 - 1.9 mmol/L  Blood culture (routine x 2)     Status: None (Preliminary result)   Collection Time: 08/31/15  10:15 PM  Result Value Ref Range   Specimen Description BLOOD RIGHT ANTECUBITAL    Special Requests BOTTLES DRAWN AEROBIC AND ANAEROBIC 5CC    Culture PENDING    Report Status PENDING   Blood culture (routine x 2)     Status: None (Preliminary result)   Collection Time: 08/31/15 10:34 PM  Result Value Ref Range   Specimen Description BLOOD RIGHT WRIST    Special Requests BOTTLES DRAWN AEROBIC AND ANAEROBIC 5CC    Culture PENDING    Report Status PENDING   MRSA PCR Screening     Status: None   Collection Time: 09/01/15  2:01 AM  Result Value Ref Range   MRSA by PCR NEGATIVE NEGATIVE    Comment:        The GeneXpert MRSA Assay (FDA approved for NASAL specimens only), is one component of a comprehensive MRSA colonization surveillance program. It is not intended to diagnose MRSA infection nor to guide or monitor treatment for MRSA infections.   Lactic acid, plasma     Status: None   Collection Time: 09/01/15  5:12 AM  Result Value Ref Range   Lactic Acid, Venous 1.0 0.5 - 1.9 mmol/L  Lipid panel     Status: Abnormal   Collection Time: 09/01/15  5:25 AM  Result Value Ref Range   Cholesterol 82 0 - 200 mg/dL   Triglycerides 71 <150 mg/dL   HDL 13 (L) >40 mg/dL   Total CHOL/HDL Ratio 6.3 RATIO   VLDL 14 0 - 40 mg/dL   LDL Cholesterol 55 0 - 99 mg/dL    Comment:        Total Cholesterol/HDL:CHD Risk Coronary Heart Disease Risk Table                     Men   Women  1/2 Average Risk   3.4   3.3  Average Risk       5.0   4.4  2 X Average Risk   9.6   7.1  3 X Average Risk  23.4   11.0  Use the calculated Patient Ratio above and the CHD Risk Table to determine the patient's CHD Risk.        ATP III CLASSIFICATION (LDL):  <100     mg/dL   Optimal  100-129  mg/dL   Near or Above                    Optimal  130-159  mg/dL   Borderline  160-189  mg/dL   High  >190     mg/dL   Very High   Troponin I (q 6hr x 3)     Status: Abnormal   Collection Time: 09/01/15   5:26 AM  Result Value Ref Range   Troponin I 0.06 (HH) <0.03 ng/mL    Comment: CRITICAL RESULT CALLED TO, READ BACK BY AND VERIFIED WITH: S.BARNHILL,RN [id number] 09/01/15 [id number]   Procalcitonin     Status: None   Collection Time: 09/01/15  5:26 AM  Result Value Ref Range   Procalcitonin 5.41 ng/mL    Comment:        Interpretation: PCT > 2 ng/mL: Systemic infection (sepsis) is likely, unless other causes are known. (NOTE)         ICU PCT Algorithm               Non ICU PCT Algorithm    ----------------------------     ------------------------------         PCT < 0.25 ng/mL                 PCT < 0.1 ng/mL     Stopping of antibiotics            Stopping of antibiotics       strongly encouraged.               strongly encouraged.    ----------------------------     ------------------------------       PCT level decrease by               PCT < 0.25 ng/mL       >= 80% from peak PCT       OR PCT 0.25 - 0.5 ng/mL          Stopping of antibiotics                                             encouraged.     Stopping of antibiotics           encouraged.    ----------------------------     ------------------------------       PCT level decrease by              PCT >= 0.25 ng/mL       < 80% from peak PCT        AND PCT >= 0.5 ng/mL            Continuing antibiotics                                               encouraged.       Continuing antibiotics            encouraged.    ----------------------------     ------------------------------  PCT level increase compared          PCT > 0.5 ng/mL         with peak PCT AND          PCT >= 0.5 ng/mL             Escalation of antibiotics                                          strongly encouraged.      Escalation of antibiotics        strongly encouraged.   Troponin I (q 6hr x 3)     Status: Abnormal   Collection Time: 09/01/15 10:56 AM  Result Value Ref Range   Troponin I 0.06 (HH) <0.03 ng/mL    Comment: CRITICAL VALUE NOTED.  VALUE IS  CONSISTENT WITH PREVIOUSLY REPORTED AND CALLED VALUE.      Component Value Date/Time   SDES BLOOD RIGHT WRIST 08/31/2015 2234   SPECREQUEST BOTTLES DRAWN AEROBIC AND ANAEROBIC 5CC 08/31/2015 2234   CULT PENDING 08/31/2015 2234   REPTSTATUS PENDING 08/31/2015 2234   Dg Chest 2 View  Result Date: 08/31/2015 CLINICAL DATA:  Right-sided chest pain. EXAM: CHEST  2 VIEW COMPARISON:  None. FINDINGS: Mild right upper lobe airspace opacity. No other evidence of focal consolidation. No pulmonary edema. Normal cardiomediastinal contours. No pleural effusion or pneumothorax. IMPRESSION: Mild right upper lobe airspace opacity could indicate developing infection. Otherwise normal chest radiograph. Electronically Signed   By: Ulyses Jarred M.D.   On: 08/31/2015 19:16   Ct Head Wo Contrast  Result Date: 08/31/2015 CLINICAL DATA:  Patient was drinking and doing drugs last night and now complains of chest pain radiating to the mid abdomen. Headache. EXAM: CT HEAD WITHOUT CONTRAST TECHNIQUE: Contiguous axial images were obtained from the base of the skull through the vertex without intravenous contrast. COMPARISON:  06/21/2008 FINDINGS: Brain: Examination technically limited due to motion artifact. Ventricles and sulci appear symmetrical. No ventricular dilatation. No mass effect or midline shift. No abnormal extra-axial fluid collections. Gray-white matter junctions are distinct. Basal cisterns are not effaced. No evidence of acute intracranial hemorrhage. Vascular: No hyperdense vessel or unexpected calcification. Skull: No depressed skull fractures. Sinuses/Orbits: No acute finding. Other: No significant changes since previous study. IMPRESSION: No acute intracranial abnormalities. Electronically Signed   By: Lucienne Capers M.D.   On: 08/31/2015 21:22   Ct Angio Chest Aorta W/cm &/or Wo/cm  Result Date: 08/31/2015 CLINICAL DATA:  Chest pain radiating to the abdomen. History of IV drug use. EXAM: CT ANGIOGRAPHY  CHEST, ABDOMEN AND PELVIS TECHNIQUE: Multidetector CT imaging through the chest, abdomen and pelvis was performed using the standard protocol during bolus administration of intravenous contrast. Multiplanar reconstructed images and MIPs were obtained and reviewed to evaluate the vascular anatomy. CONTRAST:  100 cc Isovue 370 IV COMPARISON:  Chest radiograph earlier this day. FINDINGS: CTA CHEST FINDINGS Cardiovascular: No aortic dissection, aneurysm, hematoma, or acute aortic syndrome. Thoracic aorta is normal in caliber. Heart is normal in size. No filling defects in the central pulmonary arteries. Mediastinum/nodes: Fluid abutting the right mediastinal border measuring up to 3.4 cm may be a loculated pleural effusion versus less likely pericardial effusion. This is heterogeneous fluid density with minimal peripheral but no internal enhancement. Enlarged right hilar node measures 14 mm. Small prevascular nodes. There is a prominent subcarinal node. Probable  epicardial node. Lungs/pleura: Multifocal pulmonary nodules in a pattern consistent with septic emboli/infection. These are bilateral and in falls all lobes of both lungs. Greatest burden is in the upper lobes were there is probable early cavitation. Nodule conglomerate in the upper lobe measures 3.4 x 1.9 cm. Within the medial right lung base his consolidation with central air, suspicious for necrotizing infection/ pneumonia. There is a partially loculated right pleural effusion measuring up to 2 cm, some peripheral enhancement. Trace left pleural effusion. Musculoskeletal: There are no acute or suspicious osseous abnormalities. Review of the MIP images confirms the above findings. CTA ABDOMEN AND PELVIS FINDINGS Aorta/vascular: Abdominal aorta is normal in caliber. There is no dissection, aneurysm, or acute aortic syndrome. Normal variant anatomy with small likely a accessory hepatic Ing gastric artery arising directly from the aorta. Celiac, superior  mesenteric, and inferior mesenteric arteries are patent. Single bilateral renal arteries are patent. Normal caliber bilateral common and external iliac arteries. Liver: Normal for arterial phase imaging. Hepatobiliary: Gallbladder physiologically distended, no calcified stone. No biliary dilatation. Pancreas: No ductal dilatation or inflammation. Spleen: Normal for arterial phase imaging. Adrenal glands: No nodule. Kidneys: Symmetric renal enhancement. No hydronephrosis. No perinephric edema. Stomach/Bowel: Stomach physiologically distended. There are no dilated or thickened small bowel loops. Small volume of stool throughout the colon without colonic wall thickening. The appendix is normal. Vascular/Lymphatic: No retroperitoneal, mesenteric, or pelvic adenopathy. Reproductive: No acute abnormality. Bladder: Physiologically distended without wall thickening. Other: No free air, free fluid, or intra-abdominal fluid collection. Musculoskeletal: There are no acute or suspicious osseous abnormalities. Review of the MIP images confirms the above findings. IMPRESSION: 1. Findings consistent with bilateral septic emboli involving all lobes of both lungs. There is evidence of central necrosis involving right upper and lower lobe lesions. Partially loculated right pleural effusion. Fluid adjacent of right heart border is likely loculated pleural fluid versus less likely pericardial fluid. Mediastinal and right hilar adenopathy is likely reactive. 2. No aortic dissection. 3. No acute abnormality in the abdomen/pelvis. These results were called by telephone at the time of interpretation on 08/31/2015 at 10:04 pm to Dr. Charlesetta Shanks , who verbally acknowledged these results. Electronically Signed   By: Jeb Levering M.D.   On: 08/31/2015 22:05   Ct Angio Abd/pel W/ And/or W/o  Result Date: 08/31/2015 CLINICAL DATA:  Chest pain radiating to the abdomen. History of IV drug use. EXAM: CT ANGIOGRAPHY CHEST, ABDOMEN AND  PELVIS TECHNIQUE: Multidetector CT imaging through the chest, abdomen and pelvis was performed using the standard protocol during bolus administration of intravenous contrast. Multiplanar reconstructed images and MIPs were obtained and reviewed to evaluate the vascular anatomy. CONTRAST:  100 cc Isovue 370 IV COMPARISON:  Chest radiograph earlier this day. FINDINGS: CTA CHEST FINDINGS Cardiovascular: No aortic dissection, aneurysm, hematoma, or acute aortic syndrome. Thoracic aorta is normal in caliber. Heart is normal in size. No filling defects in the central pulmonary arteries. Mediastinum/nodes: Fluid abutting the right mediastinal border measuring up to 3.4 cm may be a loculated pleural effusion versus less likely pericardial effusion. This is heterogeneous fluid density with minimal peripheral but no internal enhancement. Enlarged right hilar node measures 14 mm. Small prevascular nodes. There is a prominent subcarinal node. Probable epicardial node. Lungs/pleura: Multifocal pulmonary nodules in a pattern consistent with septic emboli/infection. These are bilateral and in falls all lobes of both lungs. Greatest burden is in the upper lobes were there is probable early cavitation. Nodule conglomerate in the upper lobe measures 3.4 x  1.9 cm. Within the medial right lung base his consolidation with central air, suspicious for necrotizing infection/ pneumonia. There is a partially loculated right pleural effusion measuring up to 2 cm, some peripheral enhancement. Trace left pleural effusion. Musculoskeletal: There are no acute or suspicious osseous abnormalities. Review of the MIP images confirms the above findings. CTA ABDOMEN AND PELVIS FINDINGS Aorta/vascular: Abdominal aorta is normal in caliber. There is no dissection, aneurysm, or acute aortic syndrome. Normal variant anatomy with small likely a accessory hepatic Ing gastric artery arising directly from the aorta. Celiac, superior mesenteric, and inferior  mesenteric arteries are patent. Single bilateral renal arteries are patent. Normal caliber bilateral common and external iliac arteries. Liver: Normal for arterial phase imaging. Hepatobiliary: Gallbladder physiologically distended, no calcified stone. No biliary dilatation. Pancreas: No ductal dilatation or inflammation. Spleen: Normal for arterial phase imaging. Adrenal glands: No nodule. Kidneys: Symmetric renal enhancement. No hydronephrosis. No perinephric edema. Stomach/Bowel: Stomach physiologically distended. There are no dilated or thickened small bowel loops. Small volume of stool throughout the colon without colonic wall thickening. The appendix is normal. Vascular/Lymphatic: No retroperitoneal, mesenteric, or pelvic adenopathy. Reproductive: No acute abnormality. Bladder: Physiologically distended without wall thickening. Other: No free air, free fluid, or intra-abdominal fluid collection. Musculoskeletal: There are no acute or suspicious osseous abnormalities. Review of the MIP images confirms the above findings. IMPRESSION: 1. Findings consistent with bilateral septic emboli involving all lobes of both lungs. There is evidence of central necrosis involving right upper and lower lobe lesions. Partially loculated right pleural effusion. Fluid adjacent of right heart border is likely loculated pleural fluid versus less likely pericardial fluid. Mediastinal and right hilar adenopathy is likely reactive. 2. No aortic dissection. 3. No acute abnormality in the abdomen/pelvis. These results were called by telephone at the time of interpretation on 08/31/2015 at 10:04 pm to Dr. Charlesetta Shanks , who verbally acknowledged these results. Electronically Signed   By: Jeb Levering M.D.   On: 08/31/2015 22:05   Recent Results (from the past 240 hour(s))  Culture, blood (routine x 2)     Status: None (Preliminary result)   Collection Time: 08/31/15  6:40 PM  Result Value Ref Range Status   Specimen  Description BLOOD LEFT ANTECUBITAL  Final   Special Requests BOTTLES DRAWN AEROBIC AND ANAEROBIC 5CC  Final   Culture PENDING  Incomplete   Report Status PENDING  Incomplete  Culture, blood (routine x 2)     Status: None (Preliminary result)   Collection Time: 08/31/15  7:00 PM  Result Value Ref Range Status   Specimen Description BLOOD RIGHT HAND  Final   Special Requests BOTTLES DRAWN AEROBIC ONLY 3CC  Final   Culture PENDING  Incomplete   Report Status PENDING  Incomplete  Blood culture (routine x 2)     Status: None (Preliminary result)   Collection Time: 08/31/15 10:15 PM  Result Value Ref Range Status   Specimen Description BLOOD RIGHT ANTECUBITAL  Final   Special Requests BOTTLES DRAWN AEROBIC AND ANAEROBIC 5CC  Final   Culture PENDING  Incomplete   Report Status PENDING  Incomplete  Blood culture (routine x 2)     Status: None (Preliminary result)   Collection Time: 08/31/15 10:34 PM  Result Value Ref Range Status   Specimen Description BLOOD RIGHT WRIST  Final   Special Requests BOTTLES DRAWN AEROBIC AND ANAEROBIC 5CC  Final   Culture PENDING  Incomplete   Report Status PENDING  Incomplete  MRSA PCR Screening  Status: None   Collection Time: 09/01/15  2:01 AM  Result Value Ref Range Status   MRSA by PCR NEGATIVE NEGATIVE Final    Comment:        The GeneXpert MRSA Assay (FDA approved for NASAL specimens only), is one component of a comprehensive MRSA colonization surveillance program. It is not intended to diagnose MRSA infection nor to guide or monitor treatment for MRSA infections.       09/01/2015, 2:49 PM     LOS: 0 days    Records and images were personally reviewed where available.

## 2015-09-02 DIAGNOSIS — M542 Cervicalgia: Secondary | ICD-10-CM

## 2015-09-02 DIAGNOSIS — F1910 Other psychoactive substance abuse, uncomplicated: Secondary | ICD-10-CM

## 2015-09-02 DIAGNOSIS — M549 Dorsalgia, unspecified: Secondary | ICD-10-CM

## 2015-09-02 DIAGNOSIS — R7989 Other specified abnormal findings of blood chemistry: Secondary | ICD-10-CM

## 2015-09-02 LAB — COMPREHENSIVE METABOLIC PANEL
ALBUMIN: 2.3 g/dL — AB (ref 3.5–5.0)
ALT: 20 U/L (ref 17–63)
ANION GAP: 8 (ref 5–15)
AST: 23 U/L (ref 15–41)
Alkaline Phosphatase: 51 U/L (ref 38–126)
BILIRUBIN TOTAL: 1.3 mg/dL — AB (ref 0.3–1.2)
BUN: 8 mg/dL (ref 6–20)
CO2: 27 mmol/L (ref 22–32)
Calcium: 8.6 mg/dL — ABNORMAL LOW (ref 8.9–10.3)
Chloride: 96 mmol/L — ABNORMAL LOW (ref 101–111)
Creatinine, Ser: 0.84 mg/dL (ref 0.61–1.24)
GFR calc non Af Amer: >60 mL/min (ref >60)
GLUCOSE: 110 mg/dL — AB (ref 65–99)
POTASSIUM: 3.6 mmol/L (ref 3.5–5.1)
Sodium: 131 mmol/L — ABNORMAL LOW (ref 135–145)
TOTAL PROTEIN: 6.3 g/dL — AB (ref 6.5–8.1)

## 2015-09-02 LAB — HEPATITIS PANEL, ACUTE
HEP B C IGM: NEGATIVE
HEP B S AG: NEGATIVE
Hep A IgM: NEGATIVE

## 2015-09-02 LAB — CBC
HEMATOCRIT: 42.2 % (ref 39.0–52.0)
Hemoglobin: 14.6 g/dL (ref 13.0–17.0)
MCH: 31.6 pg (ref 26.0–34.0)
MCHC: 34.6 g/dL (ref 30.0–36.0)
MCV: 91.3 fL (ref 78.0–100.0)
Platelets: 185 10*3/uL (ref 150–400)
RBC: 4.62 MIL/uL (ref 4.22–5.81)
RDW: 13.1 % (ref 11.5–15.5)
WBC: 11.7 10*3/uL — ABNORMAL HIGH (ref 4.0–10.5)

## 2015-09-02 LAB — VANCOMYCIN, TROUGH: VANCOMYCIN TR: 15 ug/mL (ref 15–20)

## 2015-09-02 LAB — HEMOGLOBIN A1C
Hgb A1c MFr Bld: 5 % (ref 4.8–5.6)
Mean Plasma Glucose: 97 mg/dL

## 2015-09-02 LAB — HIV ANTIBODY (ROUTINE TESTING W REFLEX): HIV SCREEN 4TH GENERATION: NONREACTIVE

## 2015-09-02 MED ORDER — SODIUM CHLORIDE 0.9 % IV SOLN
1000.0000 mL | INTRAVENOUS | Status: DC
  Administered 2015-09-02 (×2): 1000 mL via INTRAVENOUS

## 2015-09-02 NOTE — Progress Notes (Signed)
PROGRESS NOTE        PATIENT DETAILS Name: Kenneth Davis Age: 25 y.o. Sex: male Date of Birth: Jan 03, 1991 Admit Date: 08/31/2015 Admitting Physician Lorretta Harp, MD PCP:No PCP Per Patient  Brief Narrative: Patient is a 25 y.o. male with history of IV drug use (cocaine), marijuana use presented with almost 1 week history of fever with chills, generalized body aches and headaches and cough. Found to be febrile on initial presentation to the ED, underwent CT scan of the head, chest and abdomen. CT chest was consistent with bilateral septic emboli. CT head did not show any acute abnormalities. Patient was presumed to have infective endocarditis, and admitted to the hospitalist service. Blood cultures are currently pending.  Subjective: Awake and alert-cough somewhat better.No headache this am  Assessment/Plan: Principal Problem: Sepsis secondary to presumed infective endocarditis with septic emboli to the lungs: Sepsis pathophysiology improving, still febrile-continue with empiric antibiotics and IV fluids. Blood cultures 8/25 negative so far, however repeat blood cx on 8/25 and 8/26 are still pending. Await TTE, appreciate ID input.  Active Problems: Chest pain: Atypical as it is mostly bilateral, likley secondary to multiple septic emboli and repeated coughing- did have minimally elevated troponin-trend is flat and not consistent with ACS. Apart from an echo doubt further workup is required.  GERD: Continue Pepcid  Polysubstance abuse: Acknowledges cocaine (claims last use was 3-4 weeks back), marijuana and occasional alcohol use. Continue Ativan per [facility] protocol. Social work consultation prior to discharge for outpatient substance abuse rehabilitation resources. He has been counseled extensively.  DVT Prophylaxis: Prophylactic Lovenox   Code Status: Full code   Family Communication: Sister at bedside   Disposition Plan: Remain inpatient-but will likely  require home health IV antibiotics on discharge. Will require several more days of hospitalization.  Antimicrobial agents: IV vancomycin 8/25>> IV cefepime 8/26>>  Procedures: None  CONSULTS:  ID  Time spent: 25 minutes-Greater than 50% of this time was spent in counseling, explanation of diagnosis, planning of further management, and coordination of care.  MEDICATIONS: Anti-infectives    Start     Dose/Rate Route Frequency Ordered Stop   09/01/15 0600  ceFEPIme (MAXIPIME) 2 g in dextrose 5 % 50 mL IVPB     2 g 100 mL/hr over 30 Minutes Intravenous Every 8 hours 09/01/15 0252     09/01/15 0600  vancomycin (VANCOCIN) IVPB 1000 mg/200 mL premix     1,000 mg 200 mL/hr over 60 Minutes Intravenous Every 8 hours 09/01/15 0304     08/31/15 2200  vancomycin (VANCOCIN) IVPB 1000 mg/200 mL premix     1,000 mg 200 mL/hr over 60 Minutes Intravenous  Once 08/31/15 2158 09/01/15 0001   08/31/15 2200  cefTRIAXone (ROCEPHIN) 2 g in dextrose 5 % 50 mL IVPB     2 g 100 mL/hr over 30 Minutes Intravenous  Once 08/31/15 2158 09/01/15 0031      Scheduled Meds: . ceFEPime (MAXIPIME) IV  2 g Intravenous Q8H  . enoxaparin (LOVENOX) injection  40 mg Subcutaneous Q24H  . famotidine  20 mg Oral BID  . folic acid  1 mg Oral Daily  . LORazepam  0-4 mg Intravenous Q6H   Followed by  . [START ON 09/03/2015] LORazepam  0-4 mg Intravenous Q12H  . multivitamin with minerals  1 tablet Oral Daily  . nicotine  21 mg Transdermal  Daily  . thiamine  100 mg Oral Daily   Or  . thiamine  100 mg Intravenous Daily  . vancomycin  1,000 mg Intravenous Q8H   Continuous Infusions: . sodium chloride 1,000 mL (09/02/15 0750)   PRN Meds:.acetaminophen, albuterol, alum & mag hydroxide-simeth, chlorpheniramine-HYDROcodone, guaiFENesin-dextromethorphan, hydrocortisone, hydrocortisone cream, lip balm, loratadine, LORazepam **OR** LORazepam, MUSCLE RUB, nitroGLYCERIN, ondansetron (ZOFRAN) IV, oxyCODONE, phenol, polyvinyl  alcohol, sodium chloride   PHYSICAL EXAM: Vital signs: Vitals:   09/02/15 0215 09/02/15 0522 09/02/15 0746 09/02/15 1142  BP:  (!) 103/56 112/71 (!) 96/42  Pulse:  (!) 101 (!) 101 (!) 105  Resp:  19 18 (!) 29  Temp: 99.4 F (37.4 C) 100 F (37.8 C) (!) 101.1 F (38.4 C) 100.1 F (37.8 C)  TempSrc: Oral  Oral Oral  SpO2:  96% 98% 98%  Weight:  64.2 kg (141 lb 8 oz)    Height:       Filed Weights   08/31/15 1717 09/01/15 0200 09/02/15 0522  Weight: 63.5 kg (140 lb) 63.5 kg (140 lb) 64.2 kg (141 lb 8 oz)   Body mass index is 20.3 kg/m.   Gen Exam: Awake and alert with clear speech. Not in any distress Neck: Supple, No JVD.   Chest: B/L Clear.   CVS: S1 S2 Regular-no murmurs Abdomen: soft, BS +, non tender, non distended.  Extremities: no edema, lower extremities warm to touch. Neurologic: Non Focal.   Skin: No Rash or lesions   Wounds: N/A.    I have personally reviewed following labs and imaging studies  LABORATORY DATA: CBC:  Recent Labs Lab 08/31/15 1900 09/02/15 0105  WBC 13.2* 11.7*  NEUTROABS 10.2*  --   HGB 17.2* 14.6  HCT 48.2 42.2  MCV 88.8 91.3  PLT 155 185    Basic Metabolic Panel:  Recent Labs Lab 08/31/15 1900 09/02/15 0105  NA 131* 131*  K 3.9 3.6  CL 94* 96*  CO2 25 27  GLUCOSE 116* 110*  BUN 16 8  CREATININE 0.96 0.84  CALCIUM 8.7* 8.6*    GFR: Estimated Creatinine Clearance: 124.2 mL/min (by C-G formula based on SCr of 0.84 mg/dL).  Liver Function Tests:  Recent Labs Lab 08/31/15 1900 09/02/15 0105  AST 32 23  ALT 23 20  ALKPHOS 61 51  BILITOT 1.9* 1.3*  PROT 8.0 6.3*  ALBUMIN 3.2* 2.3*    Recent Labs Lab 08/31/15 1900  LIPASE 38   No results for input(s): AMMONIA in the last 168 hours.  Coagulation Profile:  Recent Labs Lab 08/31/15 1900  INR 1.10    Cardiac Enzymes:  Recent Labs Lab 08/31/15 1900 09/01/15 0526 09/01/15 1056  TROPONINI 0.29* 0.06* 0.06*    BNP (last 3 results) No results  for input(s): PROBNP in the last 8760 hours.  HbA1C:  Recent Labs  09/01/15 0525  HGBA1C 5.0    CBG:  Recent Labs Lab 08/31/15 1722 09/01/15 2112  GLUCAP 203* 101*    Lipid Profile:  Recent Labs  09/01/15 0525  CHOL 82  HDL 13*  LDLCALC 55  TRIG 71  CHOLHDL 6.3    Thyroid Function Tests: No results for input(s): TSH, T4TOTAL, FREET4, T3FREE, THYROIDAB in the last 72 hours.  Anemia Panel: No results for input(s): VITAMINB12, FOLATE, FERRITIN, TIBC, IRON, RETICCTPCT in the last 72 hours.  Urine analysis:    Component Value Date/Time   COLORURINE ORANGE (A) 08/31/2015 1725   APPEARANCEUR CLEAR 08/31/2015 1725   LABSPEC 1.029 08/31/2015 1725  PHURINE 6.0 08/31/2015 1725   GLUCOSEU 100 (A) 08/31/2015 1725   HGBUR NEGATIVE 08/31/2015 1725   BILIRUBINUR SMALL (A) 08/31/2015 1725   KETONESUR 15 (A) 08/31/2015 1725   PROTEINUR 100 (A) 08/31/2015 1725   NITRITE NEGATIVE 08/31/2015 1725   LEUKOCYTESUR TRACE (A) 08/31/2015 1725    Sepsis Labs: Lactic Acid, Venous    Component Value Date/Time   LATICACIDVEN 1.0 09/01/2015 0512    MICROBIOLOGY: Recent Results (from the past 240 hour(s))  Culture, blood (routine x 2)     Status: None (Preliminary result)   Collection Time: 08/31/15  6:40 PM  Result Value Ref Range Status   Specimen Description BLOOD LEFT ANTECUBITAL  Final   Special Requests BOTTLES DRAWN AEROBIC AND ANAEROBIC 5CC  Final   Culture   Final    NO GROWTH < 24 HOURS Performed at [facility]    Report Status PENDING  Incomplete  Culture, blood (routine x 2)     Status: None (Preliminary result)   Collection Time: 08/31/15  7:00 PM  Result Value Ref Range Status   Specimen Description BLOOD RIGHT HAND  Final   Special Requests BOTTLES DRAWN AEROBIC ONLY 3CC  Final   Culture   Final    NO GROWTH < 24 HOURS Performed at [facility]    Report Status PENDING  Incomplete  Blood culture (routine x 2)     Status: None  (Preliminary result)   Collection Time: 08/31/15 10:15 PM  Result Value Ref Range Status   Specimen Description BLOOD RIGHT ANTECUBITAL  Final   Special Requests BOTTLES DRAWN AEROBIC AND ANAEROBIC 5CC  Final   Culture PENDING  Incomplete   Report Status PENDING  Incomplete  Blood culture (routine x 2)     Status: None (Preliminary result)   Collection Time: 08/31/15 10:34 PM  Result Value Ref Range Status   Specimen Description BLOOD RIGHT WRIST  Final   Special Requests BOTTLES DRAWN AEROBIC AND ANAEROBIC 5CC  Final   Culture PENDING  Incomplete   Report Status PENDING  Incomplete  MRSA PCR Screening     Status: None   Collection Time: 09/01/15  2:01 AM  Result Value Ref Range Status   MRSA by PCR NEGATIVE NEGATIVE Final    Comment:        The GeneXpert MRSA Assay (FDA approved for NASAL specimens only), is one component of a comprehensive MRSA colonization surveillance program. It is not intended to diagnose MRSA infection nor to guide or monitor treatment for MRSA infections.     RADIOLOGY STUDIES/RESULTS: Dg Chest 2 View  Result Date: 08/31/2015 CLINICAL DATA:  Right-sided chest pain. EXAM: CHEST  2 VIEW COMPARISON:  None. FINDINGS: Mild right upper lobe airspace opacity. No other evidence of focal consolidation. No pulmonary edema. Normal cardiomediastinal contours. No pleural effusion or pneumothorax. IMPRESSION: Mild right upper lobe airspace opacity could indicate developing infection. Otherwise normal chest radiograph. Electronically Signed   By: Deatra Robinson M.D.   On: 08/31/2015 19:16   Ct Head Wo Contrast  Result Date: 08/31/2015 CLINICAL DATA:  Patient was drinking and doing drugs last night and now complains of chest pain radiating to the mid abdomen. Headache. EXAM: CT HEAD WITHOUT CONTRAST TECHNIQUE: Contiguous axial images were obtained from the base of the skull through the vertex without intravenous contrast. COMPARISON:  06/21/2008 FINDINGS: Brain:  Examination technically limited due to motion artifact. Ventricles and sulci appear symmetrical. No ventricular dilatation. No mass effect or midline shift.  No abnormal extra-axial fluid collections. Gray-white matter junctions are distinct. Basal cisterns are not effaced. No evidence of acute intracranial hemorrhage. Vascular: No hyperdense vessel or unexpected calcification. Skull: No depressed skull fractures. Sinuses/Orbits: No acute finding. Other: No significant changes since previous study. IMPRESSION: No acute intracranial abnormalities. Electronically Signed   By: Burman NievesWilliam  Stevens M.D.   On: 08/31/2015 21:22   Ct Angio Chest Aorta W/cm &/or Wo/cm  Result Date: 08/31/2015 CLINICAL DATA:  Chest pain radiating to the abdomen. History of IV drug use. EXAM: CT ANGIOGRAPHY CHEST, ABDOMEN AND PELVIS TECHNIQUE: Multidetector CT imaging through the chest, abdomen and pelvis was performed using the standard protocol during bolus administration of intravenous contrast. Multiplanar reconstructed images and MIPs were obtained and reviewed to evaluate the vascular anatomy. CONTRAST:  100 cc Isovue 370 IV COMPARISON:  Chest radiograph earlier this day. FINDINGS: CTA CHEST FINDINGS Cardiovascular: No aortic dissection, aneurysm, hematoma, or acute aortic syndrome. Thoracic aorta is normal in caliber. Heart is normal in size. No filling defects in the central pulmonary arteries. Mediastinum/nodes: Fluid abutting the right mediastinal border measuring up to 3.4 cm may be a loculated pleural effusion versus less likely pericardial effusion. This is heterogeneous fluid density with minimal peripheral but no internal enhancement. Enlarged right hilar node measures 14 mm. Small prevascular nodes. There is a prominent subcarinal node. Probable epicardial node. Lungs/pleura: Multifocal pulmonary nodules in a pattern consistent with septic emboli/infection. These are bilateral and in falls all lobes of both lungs. Greatest  burden is in the upper lobes were there is probable early cavitation. Nodule conglomerate in the upper lobe measures 3.4 x 1.9 cm. Within the medial right lung base his consolidation with central air, suspicious for necrotizing infection/ pneumonia. There is a partially loculated right pleural effusion measuring up to 2 cm, some peripheral enhancement. Trace left pleural effusion. Musculoskeletal: There are no acute or suspicious osseous abnormalities. Review of the MIP images confirms the above findings. CTA ABDOMEN AND PELVIS FINDINGS Aorta/vascular: Abdominal aorta is normal in caliber. There is no dissection, aneurysm, or acute aortic syndrome. Normal variant anatomy with small likely a accessory hepatic Ing gastric artery arising directly from the aorta. Celiac, superior mesenteric, and inferior mesenteric arteries are patent. Single bilateral renal arteries are patent. Normal caliber bilateral common and external iliac arteries. Liver: Normal for arterial phase imaging. Hepatobiliary: Gallbladder physiologically distended, no calcified stone. No biliary dilatation. Pancreas: No ductal dilatation or inflammation. Spleen: Normal for arterial phase imaging. Adrenal glands: No nodule. Kidneys: Symmetric renal enhancement. No hydronephrosis. No perinephric edema. Stomach/Bowel: Stomach physiologically distended. There are no dilated or thickened small bowel loops. Small volume of stool throughout the colon without colonic wall thickening. The appendix is normal. Vascular/Lymphatic: No retroperitoneal, mesenteric, or pelvic adenopathy. Reproductive: No acute abnormality. Bladder: Physiologically distended without wall thickening. Other: No free air, free fluid, or intra-abdominal fluid collection. Musculoskeletal: There are no acute or suspicious osseous abnormalities. Review of the MIP images confirms the above findings. IMPRESSION: 1. Findings consistent with bilateral septic emboli involving all lobes of both  lungs. There is evidence of central necrosis involving right upper and lower lobe lesions. Partially loculated right pleural effusion. Fluid adjacent of right heart border is likely loculated pleural fluid versus less likely pericardial fluid. Mediastinal and right hilar adenopathy is likely reactive. 2. No aortic dissection. 3. No acute abnormality in the abdomen/pelvis. These results were called by telephone at the time of interpretation on 08/31/2015 at 10:04 pm to Dr. Arby BarretteMARCY PFEIFFER , who  verbally acknowledged these results. Electronically Signed   By: Rubye Oaks M.D.   On: 08/31/2015 22:05   Ct Angio Abd/pel W/ And/or W/o  Result Date: 08/31/2015 CLINICAL DATA:  Chest pain radiating to the abdomen. History of IV drug use. EXAM: CT ANGIOGRAPHY CHEST, ABDOMEN AND PELVIS TECHNIQUE: Multidetector CT imaging through the chest, abdomen and pelvis was performed using the standard protocol during bolus administration of intravenous contrast. Multiplanar reconstructed images and MIPs were obtained and reviewed to evaluate the vascular anatomy. CONTRAST:  100 cc Isovue 370 IV COMPARISON:  Chest radiograph earlier this day. FINDINGS: CTA CHEST FINDINGS Cardiovascular: No aortic dissection, aneurysm, hematoma, or acute aortic syndrome. Thoracic aorta is normal in caliber. Heart is normal in size. No filling defects in the central pulmonary arteries. Mediastinum/nodes: Fluid abutting the right mediastinal border measuring up to 3.4 cm may be a loculated pleural effusion versus less likely pericardial effusion. This is heterogeneous fluid density with minimal peripheral but no internal enhancement. Enlarged right hilar node measures 14 mm. Small prevascular nodes. There is a prominent subcarinal node. Probable epicardial node. Lungs/pleura: Multifocal pulmonary nodules in a pattern consistent with septic emboli/infection. These are bilateral and in falls all lobes of both lungs. Greatest burden is in the upper lobes  were there is probable early cavitation. Nodule conglomerate in the upper lobe measures 3.4 x 1.9 cm. Within the medial right lung base his consolidation with central air, suspicious for necrotizing infection/ pneumonia. There is a partially loculated right pleural effusion measuring up to 2 cm, some peripheral enhancement. Trace left pleural effusion. Musculoskeletal: There are no acute or suspicious osseous abnormalities. Review of the MIP images confirms the above findings. CTA ABDOMEN AND PELVIS FINDINGS Aorta/vascular: Abdominal aorta is normal in caliber. There is no dissection, aneurysm, or acute aortic syndrome. Normal variant anatomy with small likely a accessory hepatic Ing gastric artery arising directly from the aorta. Celiac, superior mesenteric, and inferior mesenteric arteries are patent. Single bilateral renal arteries are patent. Normal caliber bilateral common and external iliac arteries. Liver: Normal for arterial phase imaging. Hepatobiliary: Gallbladder physiologically distended, no calcified stone. No biliary dilatation. Pancreas: No ductal dilatation or inflammation. Spleen: Normal for arterial phase imaging. Adrenal glands: No nodule. Kidneys: Symmetric renal enhancement. No hydronephrosis. No perinephric edema. Stomach/Bowel: Stomach physiologically distended. There are no dilated or thickened small bowel loops. Small volume of stool throughout the colon without colonic wall thickening. The appendix is normal. Vascular/Lymphatic: No retroperitoneal, mesenteric, or pelvic adenopathy. Reproductive: No acute abnormality. Bladder: Physiologically distended without wall thickening. Other: No free air, free fluid, or intra-abdominal fluid collection. Musculoskeletal: There are no acute or suspicious osseous abnormalities. Review of the MIP images confirms the above findings. IMPRESSION: 1. Findings consistent with bilateral septic emboli involving all lobes of both lungs. There is evidence of  central necrosis involving right upper and lower lobe lesions. Partially loculated right pleural effusion. Fluid adjacent of right heart border is likely loculated pleural fluid versus less likely pericardial fluid. Mediastinal and right hilar adenopathy is likely reactive. 2. No aortic dissection. 3. No acute abnormality in the abdomen/pelvis. These results were called by telephone at the time of interpretation on 08/31/2015 at 10:04 pm to Dr. Arby Barrette , who verbally acknowledged these results. Electronically Signed   By: Rubye Oaks M.D.   On: 08/31/2015 22:05     LOS: 1 day   Jeoffrey Massed, MD  [facility] Pager:[phone]  If 7PM-7AM, please contact night-coverage [email or web address] Password [email or web address] 09/02/2015, 12:05 PM

## 2015-09-02 NOTE — Progress Notes (Signed)
Pt found to have a temp of 102.6. Primary MD ordered to have blood cultures drawn if temp was greater than 101. On-call MD called with updates and notified this RN to place order for blood cultures to be drawn. Will continue monitor pt, tylenol given to help fever.

## 2015-09-02 NOTE — Plan of Care (Signed)
Problem: Physical Regulation: Goal: Signs and symptoms of infection will decrease Outcome: Not Progressing Pt spiked a temp of 102.6. Blood cultures drawn. Tylenol administered. Pt receiving multiple IV antibiotics.

## 2015-09-02 NOTE — Progress Notes (Signed)
INFECTIOUS DISEASE PROGRESS NOTE  ID: Kenneth Davis is a 25 y.o. male with  Principal Problem:   Septic pulmonary embolism (HCC) Active Problems:   Cocaine abuse   Marijuana abuse   Tobacco abuse   IVDU (intravenous drug user)   Sepsis (HCC)   Chest pain   Elevated troponin   Alcohol abuse   GERD (gastroesophageal reflux disease)  Subjective: C/o neck and back pain.   Abtx:  Anti-infectives    Start     Dose/Rate Route Frequency Ordered Stop   09/01/15 0600  ceFEPIme (MAXIPIME) 2 g in dextrose 5 % 50 mL IVPB     2 g 100 mL/hr over 30 Minutes Intravenous Every 8 hours 09/01/15 0252     09/01/15 0600  vancomycin (VANCOCIN) IVPB 1000 mg/200 mL premix     1,000 mg 200 mL/hr over 60 Minutes Intravenous Every 8 hours 09/01/15 0304     08/31/15 2200  vancomycin (VANCOCIN) IVPB 1000 mg/200 mL premix     1,000 mg 200 mL/hr over 60 Minutes Intravenous  Once 08/31/15 2158 09/01/15 0001   08/31/15 2200  cefTRIAXone (ROCEPHIN) 2 g in dextrose 5 % 50 mL IVPB     2 g 100 mL/hr over 30 Minutes Intravenous  Once 08/31/15 2158 09/01/15 0031      Medications:  Scheduled: . ceFEPime (MAXIPIME) IV  2 g Intravenous Q8H  . enoxaparin (LOVENOX) injection  40 mg Subcutaneous Q24H  . famotidine  20 mg Oral BID  . folic acid  1 mg Oral Daily  . LORazepam  0-4 mg Intravenous Q6H   Followed by  . [START ON 09/03/2015] LORazepam  0-4 mg Intravenous Q12H  . multivitamin with minerals  1 tablet Oral Daily  . nicotine  21 mg Transdermal Daily  . thiamine  100 mg Oral Daily   Or  . thiamine  100 mg Intravenous Daily  . vancomycin  1,000 mg Intravenous Q8H    Objective: Vital signs in last 24 hours: Temp:  [99.4 F (37.4 C)-103 F (39.4 C)] 100.1 F (37.8 C) (08/27 1142) Pulse Rate:  [93-105] 105 (08/27 1142) Resp:  [18-31] 29 (08/27 1142) BP: (96-112)/(42-80) 96/42 (08/27 1142) SpO2:  [96 %-99 %] 98 % (08/27 1142) Weight:  [64.2 kg (141 lb 8 oz)] 64.2 kg (141 lb 8 oz) (08/27  0522)   General appearance: alert, cooperative and no distress Resp: clear to auscultation bilaterally Cardio: regular rate and rhythm GI: normal findings: bowel sounds normal and soft, non-tender  Lab Results  Recent Labs  08/31/15 1900 09/02/15 0105  WBC 13.2* 11.7*  HGB 17.2* 14.6  HCT 48.2 42.2  NA 131* 131*  K 3.9 3.6  CL 94* 96*  CO2 25 27  BUN 16 8  CREATININE 0.96 0.84   Liver Panel  Recent Labs  08/31/15 1900 09/02/15 0105  PROT 8.0 6.3*  ALBUMIN 3.2* 2.3*  AST 32 23  ALT 23 20  ALKPHOS 61 51  BILITOT 1.9* 1.3*   Sedimentation Rate No results for input(s): ESRSEDRATE in the last 72 hours. C-Reactive Protein No results for input(s): CRP in the last 72 hours.  Microbiology: Recent Results (from the past 240 hour(s))  Culture, blood (routine x 2)     Status: None (Preliminary result)   Collection Time: 08/31/15  6:40 PM  Result Value Ref Range Status   Specimen Description BLOOD LEFT ANTECUBITAL  Final   Special Requests BOTTLES DRAWN AEROBIC AND ANAEROBIC 5CC  Final   Culture  Final    NO GROWTH 2 DAYS Performed at [facility]    Report Status PENDING  Incomplete  Culture, blood (routine x 2)     Status: None (Preliminary result)   Collection Time: 08/31/15  7:00 PM  Result Value Ref Range Status   Specimen Description BLOOD RIGHT HAND  Final   Special Requests BOTTLES DRAWN AEROBIC ONLY 3CC  Final   Culture   Final    NO GROWTH 2 DAYS Performed at [facility]    Report Status PENDING  Incomplete  Blood culture (routine x 2)     Status: None (Preliminary result)   Collection Time: 08/31/15 10:15 PM  Result Value Ref Range Status   Specimen Description BLOOD RIGHT ANTECUBITAL  Final   Special Requests BOTTLES DRAWN AEROBIC AND ANAEROBIC 5CC  Final   Culture   Final    NO GROWTH 1 DAY Performed at [facility]    Report Status PENDING  Incomplete  Blood culture (routine x 2)     Status: None (Preliminary result)    Collection Time: 08/31/15 10:34 PM  Result Value Ref Range Status   Specimen Description BLOOD RIGHT WRIST  Final   Special Requests BOTTLES DRAWN AEROBIC AND ANAEROBIC 5CC  Final   Culture   Final    NO GROWTH 1 DAY Performed at [facility]    Report Status PENDING  Incomplete  MRSA PCR Screening     Status: None   Collection Time: 09/01/15  2:01 AM  Result Value Ref Range Status   MRSA by PCR NEGATIVE NEGATIVE Final    Comment:        The GeneXpert MRSA Assay (FDA approved for NASAL specimens only), is one component of a comprehensive MRSA colonization surveillance program. It is not intended to diagnose MRSA infection nor to guide or monitor treatment for MRSA infections.   Culture, blood (routine x 2)     Status: None (Preliminary result)   Collection Time: 09/02/15 12:55 AM  Result Value Ref Range Status   Specimen Description BLOOD RIGHT HAND  Final   Special Requests IN PEDIATRIC BOTTLE 2CC  Final   Culture NO GROWTH < 12 HOURS  Final   Report Status PENDING  Incomplete  Culture, blood (routine x 2)     Status: None (Preliminary result)   Collection Time: 09/02/15  1:08 AM  Result Value Ref Range Status   Specimen Description BLOOD LEFT ARM  Final   Special Requests BOTTLES DRAWN AEROBIC AND ANAEROBIC 5CC  Final   Culture NO GROWTH < 12 HOURS  Final   Report Status PENDING  Incomplete    Studies/Results: Dg Chest 2 View  Result Date: 08/31/2015 CLINICAL DATA:  Right-sided chest pain. EXAM: CHEST  2 VIEW COMPARISON:  None. FINDINGS: Mild right upper lobe airspace opacity. No other evidence of focal consolidation. No pulmonary edema. Normal cardiomediastinal contours. No pleural effusion or pneumothorax. IMPRESSION: Mild right upper lobe airspace opacity could indicate developing infection. Otherwise normal chest radiograph. Electronically Signed   By: Deatra Robinson M.D.   On: 08/31/2015 19:16   Ct Head Wo Contrast  Result Date: 08/31/2015 CLINICAL DATA:   Patient was drinking and doing drugs last night and now complains of chest pain radiating to the mid abdomen. Headache. EXAM: CT HEAD WITHOUT CONTRAST TECHNIQUE: Contiguous axial images were obtained from the base of the skull through the vertex without intravenous contrast. COMPARISON:  06/21/2008 FINDINGS: Brain: Examination technically limited due to  motion artifact. Ventricles and sulci appear symmetrical. No ventricular dilatation. No mass effect or midline shift. No abnormal extra-axial fluid collections. Gray-white matter junctions are distinct. Basal cisterns are not effaced. No evidence of acute intracranial hemorrhage. Vascular: No hyperdense vessel or unexpected calcification. Skull: No depressed skull fractures. Sinuses/Orbits: No acute finding. Other: No significant changes since previous study. IMPRESSION: No acute intracranial abnormalities. Electronically Signed   By: Burman NievesWilliam  Stevens M.D.   On: 08/31/2015 21:22   Ct Angio Chest Aorta W/cm &/or Wo/cm  Result Date: 08/31/2015 CLINICAL DATA:  Chest pain radiating to the abdomen. History of IV drug use. EXAM: CT ANGIOGRAPHY CHEST, ABDOMEN AND PELVIS TECHNIQUE: Multidetector CT imaging through the chest, abdomen and pelvis was performed using the standard protocol during bolus administration of intravenous contrast. Multiplanar reconstructed images and MIPs were obtained and reviewed to evaluate the vascular anatomy. CONTRAST:  100 cc Isovue 370 IV COMPARISON:  Chest radiograph earlier this day. FINDINGS: CTA CHEST FINDINGS Cardiovascular: No aortic dissection, aneurysm, hematoma, or acute aortic syndrome. Thoracic aorta is normal in caliber. Heart is normal in size. No filling defects in the central pulmonary arteries. Mediastinum/nodes: Fluid abutting the right mediastinal border measuring up to 3.4 cm may be a loculated pleural effusion versus less likely pericardial effusion. This is heterogeneous fluid density with minimal peripheral but no  internal enhancement. Enlarged right hilar node measures 14 mm. Small prevascular nodes. There is a prominent subcarinal node. Probable epicardial node. Lungs/pleura: Multifocal pulmonary nodules in a pattern consistent with septic emboli/infection. These are bilateral and in falls all lobes of both lungs. Greatest burden is in the upper lobes were there is probable early cavitation. Nodule conglomerate in the upper lobe measures 3.4 x 1.9 cm. Within the medial right lung base his consolidation with central air, suspicious for necrotizing infection/ pneumonia. There is a partially loculated right pleural effusion measuring up to 2 cm, some peripheral enhancement. Trace left pleural effusion. Musculoskeletal: There are no acute or suspicious osseous abnormalities. Review of the MIP images confirms the above findings. CTA ABDOMEN AND PELVIS FINDINGS Aorta/vascular: Abdominal aorta is normal in caliber. There is no dissection, aneurysm, or acute aortic syndrome. Normal variant anatomy with small likely a accessory hepatic Ing gastric artery arising directly from the aorta. Celiac, superior mesenteric, and inferior mesenteric arteries are patent. Single bilateral renal arteries are patent. Normal caliber bilateral common and external iliac arteries. Liver: Normal for arterial phase imaging. Hepatobiliary: Gallbladder physiologically distended, no calcified stone. No biliary dilatation. Pancreas: No ductal dilatation or inflammation. Spleen: Normal for arterial phase imaging. Adrenal glands: No nodule. Kidneys: Symmetric renal enhancement. No hydronephrosis. No perinephric edema. Stomach/Bowel: Stomach physiologically distended. There are no dilated or thickened small bowel loops. Small volume of stool throughout the colon without colonic wall thickening. The appendix is normal. Vascular/Lymphatic: No retroperitoneal, mesenteric, or pelvic adenopathy. Reproductive: No acute abnormality. Bladder: Physiologically distended  without wall thickening. Other: No free air, free fluid, or intra-abdominal fluid collection. Musculoskeletal: There are no acute or suspicious osseous abnormalities. Review of the MIP images confirms the above findings. IMPRESSION: 1. Findings consistent with bilateral septic emboli involving all lobes of both lungs. There is evidence of central necrosis involving right upper and lower lobe lesions. Partially loculated right pleural effusion. Fluid adjacent of right heart border is likely loculated pleural fluid versus less likely pericardial fluid. Mediastinal and right hilar adenopathy is likely reactive. 2. No aortic dissection. 3. No acute abnormality in the abdomen/pelvis. These results were called by telephone  at the time of interpretation on 08/31/2015 at 10:04 pm to Dr. Arby Barrette , who verbally acknowledged these results. Electronically Signed   By: Rubye Oaks M.D.   On: 08/31/2015 22:05   Ct Angio Abd/pel W/ And/or W/o  Result Date: 08/31/2015 CLINICAL DATA:  Chest pain radiating to the abdomen. History of IV drug use. EXAM: CT ANGIOGRAPHY CHEST, ABDOMEN AND PELVIS TECHNIQUE: Multidetector CT imaging through the chest, abdomen and pelvis was performed using the standard protocol during bolus administration of intravenous contrast. Multiplanar reconstructed images and MIPs were obtained and reviewed to evaluate the vascular anatomy. CONTRAST:  100 cc Isovue 370 IV COMPARISON:  Chest radiograph earlier this day. FINDINGS: CTA CHEST FINDINGS Cardiovascular: No aortic dissection, aneurysm, hematoma, or acute aortic syndrome. Thoracic aorta is normal in caliber. Heart is normal in size. No filling defects in the central pulmonary arteries. Mediastinum/nodes: Fluid abutting the right mediastinal border measuring up to 3.4 cm may be a loculated pleural effusion versus less likely pericardial effusion. This is heterogeneous fluid density with minimal peripheral but no internal enhancement. Enlarged  right hilar node measures 14 mm. Small prevascular nodes. There is a prominent subcarinal node. Probable epicardial node. Lungs/pleura: Multifocal pulmonary nodules in a pattern consistent with septic emboli/infection. These are bilateral and in falls all lobes of both lungs. Greatest burden is in the upper lobes were there is probable early cavitation. Nodule conglomerate in the upper lobe measures 3.4 x 1.9 cm. Within the medial right lung base his consolidation with central air, suspicious for necrotizing infection/ pneumonia. There is a partially loculated right pleural effusion measuring up to 2 cm, some peripheral enhancement. Trace left pleural effusion. Musculoskeletal: There are no acute or suspicious osseous abnormalities. Review of the MIP images confirms the above findings. CTA ABDOMEN AND PELVIS FINDINGS Aorta/vascular: Abdominal aorta is normal in caliber. There is no dissection, aneurysm, or acute aortic syndrome. Normal variant anatomy with small likely a accessory hepatic Ing gastric artery arising directly from the aorta. Celiac, superior mesenteric, and inferior mesenteric arteries are patent. Single bilateral renal arteries are patent. Normal caliber bilateral common and external iliac arteries. Liver: Normal for arterial phase imaging. Hepatobiliary: Gallbladder physiologically distended, no calcified stone. No biliary dilatation. Pancreas: No ductal dilatation or inflammation. Spleen: Normal for arterial phase imaging. Adrenal glands: No nodule. Kidneys: Symmetric renal enhancement. No hydronephrosis. No perinephric edema. Stomach/Bowel: Stomach physiologically distended. There are no dilated or thickened small bowel loops. Small volume of stool throughout the colon without colonic wall thickening. The appendix is normal. Vascular/Lymphatic: No retroperitoneal, mesenteric, or pelvic adenopathy. Reproductive: No acute abnormality. Bladder: Physiologically distended without wall thickening.  Other: No free air, free fluid, or intra-abdominal fluid collection. Musculoskeletal: There are no acute or suspicious osseous abnormalities. Review of the MIP images confirms the above findings. IMPRESSION: 1. Findings consistent with bilateral septic emboli involving all lobes of both lungs. There is evidence of central necrosis involving right upper and lower lobe lesions. Partially loculated right pleural effusion. Fluid adjacent of right heart border is likely loculated pleural fluid versus less likely pericardial fluid. Mediastinal and right hilar adenopathy is likely reactive. 2. No aortic dissection. 3. No acute abnormality in the abdomen/pelvis. These results were called by telephone at the time of interpretation on 08/31/2015 at 10:04 pm to Dr. Arby Barrette , who verbally acknowledged these results. Electronically Signed   By: Rubye Oaks M.D.   On: 08/31/2015 22:05     Assessment/Plan: Septic Pulm Emboli IVDA  Await his  BCx Check echo Fortunately hepatitis and HIV tests are (-). I informed pt of this.   Total days of antibiotics: 1 vanco/cefepime         Johny Sax Infectious Diseases (pager) [phone] [email or web address] 09/02/2015, 12:36 PM  LOS: 1 day

## 2015-09-02 NOTE — Progress Notes (Signed)
Pharmacy Antibiotic Note  Kenneth Davis is a 25 y.o. male admitted on 08/31/2015 with septic emboli, IVDU.  Pharmacy has been consulted for cefepime and vancomycin dosing.    Vancomycin trough therapeutic at 15  Plan: Vancomycin 1000 IV every 8 hours.  Goal trough 15-20 mcg/mL.  Cefepime 2 mg IV q8h F/u renal fxn. Temp, wbc, culture data VT as needed  Height: 5\' 10"  (177.8 cm) Weight: 141 lb 8 oz (64.2 kg) IBW/kg (Calculated) : 73  Temp (24hrs), Avg:100.9 F (38.3 C), Min:99.4 F (37.4 C), Max:102.6 F (39.2 C)   Recent Labs Lab 08/31/15 1900 08/31/15 1917 09/01/15 0512 09/02/15 0105 09/02/15 2023  WBC 13.2*  --   --  11.7*  --   CREATININE 0.96  --   --  0.84  --   LATICACIDVEN  --  1.76 1.0  --   --   VANCOTROUGH  --   --   --   --  15    Estimated Creatinine Clearance: 124.2 mL/min (by C-G formula based on SCr of 0.84 mg/dL).    No Known Allergies  Thank you for allowing pharmacy to be a part of this patient's care. Okey RegalLisa Nesreen Albano, PharmD [phone] 09/02/2015 9:22 PM

## 2015-09-03 ENCOUNTER — Inpatient Hospital Stay (HOSPITAL_COMMUNITY): Payer: Self-pay

## 2015-09-03 DIAGNOSIS — F1910 Other psychoactive substance abuse, uncomplicated: Secondary | ICD-10-CM

## 2015-09-03 DIAGNOSIS — R079 Chest pain, unspecified: Secondary | ICD-10-CM

## 2015-09-03 DIAGNOSIS — J189 Pneumonia, unspecified organism: Secondary | ICD-10-CM

## 2015-09-03 LAB — CBC
HEMATOCRIT: 41.8 % (ref 39.0–52.0)
HEMOGLOBIN: 14.3 g/dL (ref 13.0–17.0)
MCH: 31.3 pg (ref 26.0–34.0)
MCHC: 34.2 g/dL (ref 30.0–36.0)
MCV: 91.5 fL (ref 78.0–100.0)
Platelets: 278 10*3/uL (ref 150–400)
RBC: 4.57 MIL/uL (ref 4.22–5.81)
RDW: 13.1 % (ref 11.5–15.5)
WBC: 13.8 10*3/uL — AB (ref 4.0–10.5)

## 2015-09-03 LAB — ECHOCARDIOGRAM COMPLETE
HEIGHTINCHES: 70 in
Weight: 2257.6 oz

## 2015-09-03 MED ORDER — AMPICILLIN-SULBACTAM SODIUM 3 (2-1) G IJ SOLR
3.0000 g | Freq: Four times a day (QID) | INTRAMUSCULAR | Status: DC
  Administered 2015-09-03 – 2015-09-06 (×12): 3 g via INTRAVENOUS
  Filled 2015-09-03 (×15): qty 3

## 2015-09-03 NOTE — Progress Notes (Signed)
Echocardiogram 2D Echocardiogram has been performed.  Kenneth Davis 09/03/2015, 10:31 AM

## 2015-09-03 NOTE — Progress Notes (Signed)
ANTIBIOTIC CONSULT NOTE - INITIAL  Pharmacy Consult for Unasyn Indication: pneumonia  No Known Allergies  Patient Measurements: Height: 5\' 10"  (177.8 cm) Weight: 141 lb 1.6 oz (64 kg) IBW/kg (Calculated) : 73 Adjusted Body Weight:    Vital Signs: Temp: 99.7 F (37.6 C) (08/28 0740) Temp Source: Oral (08/28 0740) BP: 108/56 (08/28 0740) Pulse Rate: 89 (08/28 0740) Intake/Output from previous day: 08/27 0701 - 08/28 0700 In: 1720 [P.O.:720; I.V.:750; IV Piggyback:250] Out: 2400 [Urine:2400] Intake/Output from this shift: Total I/O In: 480 [P.O.:480] Out: 400 [Urine:400]  Labs:  Recent Labs  08/31/15 1900 09/02/15 0105 09/03/15 0508  WBC 13.2* 11.7* 13.8*  HGB 17.2* 14.6 14.3  PLT 155 185 278  CREATININE 0.96 0.84  --    Estimated Creatinine Clearance: 123.8 mL/min (by C-G formula based on SCr of 0.84 mg/dL).  Recent Labs  [facility]02/15 [facility]     Microbiology:   Medical History: Past Medical History:  Diagnosis Date  . Alcohol abuse   . Cocaine abuse   . GERD (gastroesophageal reflux disease)   . IVDU (intravenous drug user)   . Marijuana abuse   . Tobacco abuse     Assessment: Kenneth Davis is a 25 y.o. male admitted on 08/31/2015 with septic emboli, IVDU.    PMH significant for IVDU.    Infectious Disease :CTA found B septic emboli involving all lobes of both lungs. Tmax- 102.6. WBC 13.2>11.7>13.8, creat 0.84. Lactate 1.0. Hepatitis and HIV are negative. (He describes his sputum as having a very foul taste and odor. Got sick 1 week ago. Friends found him very lethargic and foaming at the mouth.). Start treatment for aspiration per ID. Poor dentition.  Rocephin x 1 dose 8/25 Vanc 8/25>> Unasy 8/28 Cefepime 8/26>>8/28  8/28 VT = 15 -> no change in dose  8/26 MRSA PCR Neg 8/27 BCx: NGTD 8/25 BC x 4>>NGTD Hepatitis and HIV -negative   Goal of Therapy:  Vancomycin trough level 15-20 mcg/ml  Plan:  Vancomycin 1000 IV every 8  hours. Goal trough 15-20 mcg/mL.  D/c Cefepime Start Unasyn 3g IV q6hr. S/p Echo 8/28: f/u results F/u TTE, final blood cultures.   Jevante Hollibaugh S. Merilynn Finlandobertson, PharmD, BCPS Clinical Staff Pharmacist Pager [phone]  Misty Stanleyobertson, Sephiroth Mcluckie Stillinger 09/03/2015,11:23 AM

## 2015-09-03 NOTE — Progress Notes (Signed)
PROGRESS NOTE        PATIENT DETAILS Name: Kenneth Davis Age: 25 y.o. Sex: male Date of Birth: Mar 13, 1990 Admit Date: 08/31/2015 Admitting Physician Lorretta HarpXilin Niu, MD PCP:No PCP Per Patient  Brief Narrative: Patient is a 25 y.o. male with history of IV drug use (cocaine), marijuana use presented with almost 1 week history of fever with chills, generalized body aches and headaches and cough. Found to be febrile on initial presentation to the ED, underwent CT scan of the head, chest and abdomen. CT chest was consistent with bilateral septic emboli. CT head did not show any acute abnormalities. Patient was presumed to have infective endocarditis, and admitted to the hospitalist service. Blood cultures are currently pending.  Subjective: Awake and alert-cough slowly improving. Continues to have fever.   Assessment/Plan: Principal Problem: Sepsis secondary to presumed infective endocarditis with septic emboli to the lungs: Sepsis pathophysiology improving, still febrile-continue with empiric antibiotics and IV fluids. Multiple blood cultures on 8/25, 8/27 negative so far. Transthoracic echocardiogram negative for vegetations, spoke with Dr. Gwenlyn Saranampbell-infectious disease-he does not recommend TEE at this time. Await further recommendations from infectious disease.  Active Problems: Chest pain: Atypical as it is mostly bilateral, likley secondary to multiple septic emboli and repeated coughing- did have minimally elevated troponin-trend is flat and not consistent with ACS. Apart from an echo doubt further workup is required.  GERD: Continue Pepcid  Polysubstance abuse: Acknowledges cocaine (claims last use was 3-4 weeks back), marijuana and occasional alcohol use. Continue Ativan per [facility]WA protocol. Social work consultation prior to discharge for outpatient substance abuse rehabilitation resources. He has been counseled extensively.  DVT Prophylaxis: Prophylactic Lovenox    Code Status: Full code   Family Communication: None at bedside   Disposition Plan: Remain inpatient-but will likely require home health IV antibiotics on discharge. Will require several more days of hospitalization.  Antimicrobial agents: IV vancomycin 8/25>> IV cefepime 8/26>>  Procedures: None  CONSULTS:  ID  Time spent: 25 minutes-Greater than 50% of this time was spent in counseling, explanation of diagnosis, planning of further management, and coordination of care.  MEDICATIONS: Anti-infectives    Start     Dose/Rate Route Frequency Ordered Stop   09/03/15 1200  Ampicillin-Sulbactam (UNASYN) 3 g in sodium chloride 0.9 % 100 mL IVPB     3 g 100 mL/hr over 60 Minutes Intravenous Every 6 hours 09/03/15 1120     09/01/15 0600  ceFEPIme (MAXIPIME) 2 g in dextrose 5 % 50 mL IVPB  Status:  Discontinued     2 g 100 mL/hr over 30 Minutes Intravenous Every 8 hours 09/01/15 0252 09/03/15 1055   09/01/15 0600  vancomycin (VANCOCIN) IVPB 1000 mg/200 mL premix     1,000 mg 200 mL/hr over 60 Minutes Intravenous Every 8 hours 09/01/15 0304     08/31/15 2200  vancomycin (VANCOCIN) IVPB 1000 mg/200 mL premix     1,000 mg 200 mL/hr over 60 Minutes Intravenous  Once 08/31/15 2158 09/01/15 0001   08/31/15 2200  cefTRIAXone (ROCEPHIN) 2 g in dextrose 5 % 50 mL IVPB     2 g 100 mL/hr over 30 Minutes Intravenous  Once 08/31/15 2158 09/01/15 0031      Scheduled Meds: . ampicillin-sulbactam (UNASYN) IV  3 g Intravenous Q6H  . enoxaparin (LOVENOX) injection  40 mg Subcutaneous Q24H  . famotidine  20  mg Oral BID  . folic acid  1 mg Oral Daily  . LORazepam  0-4 mg Intravenous Q12H  . multivitamin with minerals  1 tablet Oral Daily  . nicotine  21 mg Transdermal Daily  . thiamine  100 mg Oral Daily  . vancomycin  1,000 mg Intravenous Q8H   Continuous Infusions: . sodium chloride 1,000 mL (09/02/15 2033)   PRN Meds:.acetaminophen, albuterol, alum & mag hydroxide-simeth,  chlorpheniramine-HYDROcodone, guaiFENesin-dextromethorphan, hydrocortisone, hydrocortisone cream, lip balm, loratadine, LORazepam **OR** LORazepam, MUSCLE RUB, nitroGLYCERIN, ondansetron (ZOFRAN) IV, oxyCODONE, phenol, polyvinyl alcohol, sodium chloride   PHYSICAL EXAM: Vital signs: Vitals:   09/03/15 0351 09/03/15 0625 09/03/15 0740 09/03/15 1144  BP: 111/67  (!) 108/56 94/80  Pulse: 99  89 (!) 104  Resp: (!) 23  (!) 22 (!) 27  Temp: (!) 102.1 F (38.9 C) 99.7 F (37.6 C) 99.7 F (37.6 C) 97.7 F (36.5 C)  TempSrc: Oral  Oral Axillary  SpO2: 98%  98% 98%  Weight:  64 kg (141 lb 1.6 oz)    Height:       Filed Weights   09/01/15 0200 09/02/15 0522 09/03/15 0625  Weight: 63.5 kg (140 lb) 64.2 kg (141 lb 8 oz) 64 kg (141 lb 1.6 oz)   Body mass index is 20.25 kg/m.   Gen Exam: Awake and alert with clear speech. Not in any distress Neck: Supple, No JVD.   Chest: B/L Clear.   CVS: S1 S2 Regular-no murmurs Abdomen: soft, BS +, non tender, non distended.  Extremities: no edema, lower extremities warm to touch. Neurologic: Non Focal.   Skin: No Rash or lesions   Wounds: N/A.    I have personally reviewed following labs and imaging studies  LABORATORY DATA: CBC:  Recent Labs Lab 08/31/15 1900 09/02/15 0105 09/03/15 0508  WBC 13.2* 11.7* 13.8*  NEUTROABS 10.2*  --   --   HGB 17.2* 14.6 14.3  HCT 48.2 42.2 41.8  MCV 88.8 91.3 91.5  PLT 155 185 278    Basic Metabolic Panel:  Recent Labs Lab 08/31/15 1900 09/02/15 0105  NA 131* 131*  K 3.9 3.6  CL 94* 96*  CO2 25 27  GLUCOSE 116* 110*  BUN 16 8  CREATININE 0.96 0.84  CALCIUM 8.7* 8.6*    GFR: Estimated Creatinine Clearance: 123.8 mL/min (by C-G formula based on SCr of 0.84 mg/dL).  Liver Function Tests:  Recent Labs Lab 08/31/15 1900 09/02/15 0105  AST 32 23  ALT 23 20  ALKPHOS 61 51  BILITOT 1.9* 1.3*  PROT 8.0 6.3*  ALBUMIN 3.2* 2.3*    Recent Labs Lab 08/31/15 1900  LIPASE 38   No  results for input(s): AMMONIA in the last 168 hours.  Coagulation Profile:  Recent Labs Lab 08/31/15 1900  INR 1.10    Cardiac Enzymes:  Recent Labs Lab 08/31/15 1900 09/01/15 0526 09/01/15 1056  TROPONINI 0.29* 0.06* 0.06*    BNP (last 3 results) No results for input(s): PROBNP in the last 8760 hours.  HbA1C:  Recent Labs  09/01/15 0525  HGBA1C 5.0    CBG:  Recent Labs Lab 08/31/15 1722 09/01/15 2112  GLUCAP 203* 101*    Lipid Profile:  Recent Labs  09/01/15 0525  CHOL 82  HDL 13*  LDLCALC 55  TRIG 71  CHOLHDL 6.3    Thyroid Function Tests: No results for input(s): TSH, T4TOTAL, FREET4, T3FREE, THYROIDAB in the last 72 hours.  Anemia Panel: No results for input(s): VITAMINB12, FOLATE,  FERRITIN, TIBC, IRON, RETICCTPCT in the last 72 hours.  Urine analysis:    Component Value Date/Time   COLORURINE ORANGE (A) 08/31/2015 1725   APPEARANCEUR CLEAR 08/31/2015 1725   LABSPEC 1.029 08/31/2015 1725   PHURINE 6.0 08/31/2015 1725   GLUCOSEU 100 (A) 08/31/2015 1725   HGBUR NEGATIVE 08/31/2015 1725   BILIRUBINUR SMALL (A) 08/31/2015 1725   KETONESUR 15 (A) 08/31/2015 1725   PROTEINUR 100 (A) 08/31/2015 1725   NITRITE NEGATIVE 08/31/2015 1725   LEUKOCYTESUR TRACE (A) 08/31/2015 1725    Sepsis Labs: Lactic Acid, Venous    Component Value Date/Time   LATICACIDVEN 1.0 09/01/2015 0512    MICROBIOLOGY: Recent Results (from the past 240 hour(s))  Culture, blood (routine x 2)     Status: None (Preliminary result)   Collection Time: 08/31/15  6:40 PM  Result Value Ref Range Status   Specimen Description BLOOD LEFT ANTECUBITAL  Final   Special Requests BOTTLES DRAWN AEROBIC AND ANAEROBIC 5CC  Final   Culture   Final    NO GROWTH 2 DAYS Performed at [facility]    Report Status PENDING  Incomplete  Culture, blood (routine x 2)     Status: None (Preliminary result)   Collection Time: 08/31/15  7:00 PM  Result Value Ref Range Status    Specimen Description BLOOD RIGHT HAND  Final   Special Requests BOTTLES DRAWN AEROBIC ONLY 3CC  Final   Culture   Final    NO GROWTH 2 DAYS Performed at [facility]    Report Status PENDING  Incomplete  Blood culture (routine x 2)     Status: None (Preliminary result)   Collection Time: 08/31/15 10:15 PM  Result Value Ref Range Status   Specimen Description BLOOD RIGHT ANTECUBITAL  Final   Special Requests BOTTLES DRAWN AEROBIC AND ANAEROBIC 5CC  Final   Culture   Final    NO GROWTH 1 DAY Performed at [facility]    Report Status PENDING  Incomplete  Blood culture (routine x 2)     Status: None (Preliminary result)   Collection Time: 08/31/15 10:34 PM  Result Value Ref Range Status   Specimen Description BLOOD RIGHT WRIST  Final   Special Requests BOTTLES DRAWN AEROBIC AND ANAEROBIC 5CC  Final   Culture   Final    NO GROWTH 1 DAY Performed at [facility]    Report Status PENDING  Incomplete  MRSA PCR Screening     Status: None   Collection Time: 09/01/15  2:01 AM  Result Value Ref Range Status   MRSA by PCR NEGATIVE NEGATIVE Final    Comment:        The GeneXpert MRSA Assay (FDA approved for NASAL specimens only), is one component of a comprehensive MRSA colonization surveillance program. It is not intended to diagnose MRSA infection nor to guide or monitor treatment for MRSA infections.   Culture, blood (routine x 2)     Status: None (Preliminary result)   Collection Time: 09/02/15 12:55 AM  Result Value Ref Range Status   Specimen Description BLOOD RIGHT HAND  Final   Special Requests IN PEDIATRIC BOTTLE 2CC  Final   Culture NO GROWTH < 12 HOURS  Final   Report Status PENDING  Incomplete  Culture, blood (routine x 2)     Status: None (Preliminary result)   Collection Time: 09/02/15  1:08 AM  Result Value Ref Range Status   Specimen Description BLOOD LEFT ARM  Final  Special Requests BOTTLES DRAWN AEROBIC AND ANAEROBIC 5CC  Final    Culture NO GROWTH < 12 HOURS  Final   Report Status PENDING  Incomplete    RADIOLOGY STUDIES/RESULTS: Dg Chest 2 View  Result Date: 08/31/2015 CLINICAL DATA:  Right-sided chest pain. EXAM: CHEST  2 VIEW COMPARISON:  None. FINDINGS: Mild right upper lobe airspace opacity. No other evidence of focal consolidation. No pulmonary edema. Normal cardiomediastinal contours. No pleural effusion or pneumothorax. IMPRESSION: Mild right upper lobe airspace opacity could indicate developing infection. Otherwise normal chest radiograph. Electronically Signed   By: Deatra Robinson M.D.   On: 08/31/2015 19:16   Ct Head Wo Contrast  Result Date: 08/31/2015 CLINICAL DATA:  Patient was drinking and doing drugs last night and now complains of chest pain radiating to the mid abdomen. Headache. EXAM: CT HEAD WITHOUT CONTRAST TECHNIQUE: Contiguous axial images were obtained from the base of the skull through the vertex without intravenous contrast. COMPARISON:  06/21/2008 FINDINGS: Brain: Examination technically limited due to motion artifact. Ventricles and sulci appear symmetrical. No ventricular dilatation. No mass effect or midline shift. No abnormal extra-axial fluid collections. Gray-white matter junctions are distinct. Basal cisterns are not effaced. No evidence of acute intracranial hemorrhage. Vascular: No hyperdense vessel or unexpected calcification. Skull: No depressed skull fractures. Sinuses/Orbits: No acute finding. Other: No significant changes since previous study. IMPRESSION: No acute intracranial abnormalities. Electronically Signed   By: Burman Nieves M.D.   On: 08/31/2015 21:22   Ct Angio Chest Aorta W/cm &/or Wo/cm  Result Date: 08/31/2015 CLINICAL DATA:  Chest pain radiating to the abdomen. History of IV drug use. EXAM: CT ANGIOGRAPHY CHEST, ABDOMEN AND PELVIS TECHNIQUE: Multidetector CT imaging through the chest, abdomen and pelvis was performed using the standard protocol during bolus  administration of intravenous contrast. Multiplanar reconstructed images and MIPs were obtained and reviewed to evaluate the vascular anatomy. CONTRAST:  100 cc Isovue 370 IV COMPARISON:  Chest radiograph earlier this day. FINDINGS: CTA CHEST FINDINGS Cardiovascular: No aortic dissection, aneurysm, hematoma, or acute aortic syndrome. Thoracic aorta is normal in caliber. Heart is normal in size. No filling defects in the central pulmonary arteries. Mediastinum/nodes: Fluid abutting the right mediastinal border measuring up to 3.4 cm may be a loculated pleural effusion versus less likely pericardial effusion. This is heterogeneous fluid density with minimal peripheral but no internal enhancement. Enlarged right hilar node measures 14 mm. Small prevascular nodes. There is a prominent subcarinal node. Probable epicardial node. Lungs/pleura: Multifocal pulmonary nodules in a pattern consistent with septic emboli/infection. These are bilateral and in falls all lobes of both lungs. Greatest burden is in the upper lobes were there is probable early cavitation. Nodule conglomerate in the upper lobe measures 3.4 x 1.9 cm. Within the medial right lung base his consolidation with central air, suspicious for necrotizing infection/ pneumonia. There is a partially loculated right pleural effusion measuring up to 2 cm, some peripheral enhancement. Trace left pleural effusion. Musculoskeletal: There are no acute or suspicious osseous abnormalities. Review of the MIP images confirms the above findings. CTA ABDOMEN AND PELVIS FINDINGS Aorta/vascular: Abdominal aorta is normal in caliber. There is no dissection, aneurysm, or acute aortic syndrome. Normal variant anatomy with small likely a accessory hepatic Ing gastric artery arising directly from the aorta. Celiac, superior mesenteric, and inferior mesenteric arteries are patent. Single bilateral renal arteries are patent. Normal caliber bilateral common and external iliac arteries.  Liver: Normal for arterial phase imaging. Hepatobiliary: Gallbladder physiologically distended, no calcified stone.  No biliary dilatation. Pancreas: No ductal dilatation or inflammation. Spleen: Normal for arterial phase imaging. Adrenal glands: No nodule. Kidneys: Symmetric renal enhancement. No hydronephrosis. No perinephric edema. Stomach/Bowel: Stomach physiologically distended. There are no dilated or thickened small bowel loops. Small volume of stool throughout the colon without colonic wall thickening. The appendix is normal. Vascular/Lymphatic: No retroperitoneal, mesenteric, or pelvic adenopathy. Reproductive: No acute abnormality. Bladder: Physiologically distended without wall thickening. Other: No free air, free fluid, or intra-abdominal fluid collection. Musculoskeletal: There are no acute or suspicious osseous abnormalities. Review of the MIP images confirms the above findings. IMPRESSION: 1. Findings consistent with bilateral septic emboli involving all lobes of both lungs. There is evidence of central necrosis involving right upper and lower lobe lesions. Partially loculated right pleural effusion. Fluid adjacent of right heart border is likely loculated pleural fluid versus less likely pericardial fluid. Mediastinal and right hilar adenopathy is likely reactive. 2. No aortic dissection. 3. No acute abnormality in the abdomen/pelvis. These results were called by telephone at the time of interpretation on 08/31/2015 at 10:04 pm to Dr. Arby Barrette , who verbally acknowledged these results. Electronically Signed   By: Rubye Oaks M.D.   On: 08/31/2015 22:05   Ct Angio Abd/pel W/ And/or W/o  Result Date: 08/31/2015 CLINICAL DATA:  Chest pain radiating to the abdomen. History of IV drug use. EXAM: CT ANGIOGRAPHY CHEST, ABDOMEN AND PELVIS TECHNIQUE: Multidetector CT imaging through the chest, abdomen and pelvis was performed using the standard protocol during bolus administration of intravenous  contrast. Multiplanar reconstructed images and MIPs were obtained and reviewed to evaluate the vascular anatomy. CONTRAST:  100 cc Isovue 370 IV COMPARISON:  Chest radiograph earlier this day. FINDINGS: CTA CHEST FINDINGS Cardiovascular: No aortic dissection, aneurysm, hematoma, or acute aortic syndrome. Thoracic aorta is normal in caliber. Heart is normal in size. No filling defects in the central pulmonary arteries. Mediastinum/nodes: Fluid abutting the right mediastinal border measuring up to 3.4 cm may be a loculated pleural effusion versus less likely pericardial effusion. This is heterogeneous fluid density with minimal peripheral but no internal enhancement. Enlarged right hilar node measures 14 mm. Small prevascular nodes. There is a prominent subcarinal node. Probable epicardial node. Lungs/pleura: Multifocal pulmonary nodules in a pattern consistent with septic emboli/infection. These are bilateral and in falls all lobes of both lungs. Greatest burden is in the upper lobes were there is probable early cavitation. Nodule conglomerate in the upper lobe measures 3.4 x 1.9 cm. Within the medial right lung base his consolidation with central air, suspicious for necrotizing infection/ pneumonia. There is a partially loculated right pleural effusion measuring up to 2 cm, some peripheral enhancement. Trace left pleural effusion. Musculoskeletal: There are no acute or suspicious osseous abnormalities. Review of the MIP images confirms the above findings. CTA ABDOMEN AND PELVIS FINDINGS Aorta/vascular: Abdominal aorta is normal in caliber. There is no dissection, aneurysm, or acute aortic syndrome. Normal variant anatomy with small likely a accessory hepatic Ing gastric artery arising directly from the aorta. Celiac, superior mesenteric, and inferior mesenteric arteries are patent. Single bilateral renal arteries are patent. Normal caliber bilateral common and external iliac arteries. Liver: Normal for arterial  phase imaging. Hepatobiliary: Gallbladder physiologically distended, no calcified stone. No biliary dilatation. Pancreas: No ductal dilatation or inflammation. Spleen: Normal for arterial phase imaging. Adrenal glands: No nodule. Kidneys: Symmetric renal enhancement. No hydronephrosis. No perinephric edema. Stomach/Bowel: Stomach physiologically distended. There are no dilated or thickened small bowel loops. Small volume of stool throughout  the colon without colonic wall thickening. The appendix is normal. Vascular/Lymphatic: No retroperitoneal, mesenteric, or pelvic adenopathy. Reproductive: No acute abnormality. Bladder: Physiologically distended without wall thickening. Other: No free air, free fluid, or intra-abdominal fluid collection. Musculoskeletal: There are no acute or suspicious osseous abnormalities. Review of the MIP images confirms the above findings. IMPRESSION: 1. Findings consistent with bilateral septic emboli involving all lobes of both lungs. There is evidence of central necrosis involving right upper and lower lobe lesions. Partially loculated right pleural effusion. Fluid adjacent of right heart border is likely loculated pleural fluid versus less likely pericardial fluid. Mediastinal and right hilar adenopathy is likely reactive. 2. No aortic dissection. 3. No acute abnormality in the abdomen/pelvis. These results were called by telephone at the time of interpretation on 08/31/2015 at 10:04 pm to Dr. Arby Barrette , who verbally acknowledged these results. Electronically Signed   By: Rubye Oaks M.D.   On: 08/31/2015 22:05     LOS: 2 days   Jeoffrey Massed, MD  [facility] Pager:[phone]  If 7PM-7AM, please contact night-coverage [email or web address] Password [email or web address] 09/03/2015, 1:55 PM

## 2015-09-03 NOTE — Progress Notes (Signed)
Patient ID: Kenneth Davis, male   DOB: 09-10-1990, 25 y.o.   MRN: [id number]          [facility]    Date of Admission:  08/31/2015           Day 4 vancomycin        Day 4 cefepime  Principal Problem:   Septic pulmonary embolism (HCC) Active Problems:   Cocaine abuse   Marijuana abuse   Tobacco abuse   IVDU (intravenous drug user)   Sepsis (HCC)   Chest pain   Elevated troponin   Alcohol abuse   GERD (gastroesophageal reflux disease)   Drug abuse   . ceFEPime (MAXIPIME) IV  2 g Intravenous Q8H  . enoxaparin (LOVENOX) injection  40 mg Subcutaneous Q24H  . famotidine  20 mg Oral BID  . folic acid  1 mg Oral Daily  . LORazepam  0-4 mg Intravenous Q12H  . multivitamin with minerals  1 tablet Oral Daily  . nicotine  21 mg Transdermal Daily  . thiamine  100 mg Oral Daily  . vancomycin  1,000 mg Intravenous Q8H    SUBJECTIVE: He is feeling a little bit better. He thinks his sputum is a little bit clearer today. He describes his sputum is having a very foul taste and odor. He tells me that he got sick 1 week ago. Friends found him very lethargic and foaming at the mouth. He refused to come to the hospital until he gets sicker.  Review of Systems: Review of Systems  Constitutional: Positive for chills, diaphoresis, fever and malaise/fatigue.  Respiratory: Positive for cough, sputum production and shortness of breath.   Cardiovascular: Positive for chest pain.  Gastrointestinal: Negative for abdominal pain, diarrhea, nausea and vomiting.  Genitourinary: Negative for dysuria.  Musculoskeletal: Negative for joint pain.  Psychiatric/Behavioral: Positive for substance abuse.    Past Medical History:  Diagnosis Date  . Alcohol abuse   . Cocaine abuse   . GERD (gastroesophageal reflux disease)   . IVDU (intravenous drug user)   . Marijuana abuse   . Tobacco abuse     Social History  Substance Use Topics  . Smoking status: Current Some Day  Smoker  . Smokeless tobacco: Never Used  . Alcohol use Yes    History reviewed. No pertinent family history. No Known Allergies  OBJECTIVE: Vitals:   09/02/15 2331 09/03/15 0351 09/03/15 0625 09/03/15 0740  BP: 117/66 111/67  (!) 108/56  Pulse: 86 99  89  Resp: 20 (!) 23  (!) 22  Temp: 99.8 F (37.7 C) (!) 102.1 F (38.9 C) 99.7 F (37.6 C) 99.7 F (37.6 C)  TempSrc: Oral Oral  Oral  SpO2: 96% 98%  98%  Weight:   141 lb 1.6 oz (64 kg)   Height:       Body mass index is 20.25 kg/m.  Physical Exam  Constitutional: He is oriented to person, place, and time.  He is very thin. He is in no distress resting quietly in bed.  Cardiovascular: Normal rate and regular rhythm.   No murmur heard. Pulmonary/Chest: Effort normal and breath sounds normal. He has no wheezes. He has no rales.  Abdominal: Soft. There is no tenderness.  Musculoskeletal: Normal range of motion. He exhibits no edema or tenderness.  Neurological: He is alert and oriented to person, place, and time.  Skin: No rash noted.  Psychiatric: Mood and affect normal.    Lab Results Lab Results  Component Value  Date   WBC 13.8 (H) 09/03/2015   HGB 14.3 09/03/2015   HCT 41.8 09/03/2015   MCV 91.5 09/03/2015   PLT 278 09/03/2015    Lab Results  Component Value Date   CREATININE 0.84 09/02/2015   BUN 8 09/02/2015   NA 131 (L) 09/02/2015   K 3.6 09/02/2015   CL 96 (L) 09/02/2015   CO2 27 09/02/2015    Lab Results  Component Value Date   ALT 20 09/02/2015   AST 23 09/02/2015   ALKPHOS 51 09/02/2015   BILITOT 1.3 (H) 09/02/2015     Microbiology: Recent Results (from the past 240 hour(s))  Culture, blood (routine x 2)     Status: None (Preliminary result)   Collection Time: 08/31/15  6:40 PM  Result Value Ref Range Status   Specimen Description BLOOD LEFT ANTECUBITAL  Final   Special Requests BOTTLES DRAWN AEROBIC AND ANAEROBIC 5CC  Final   Culture   Final    NO GROWTH 2 DAYS Performed at [facility]    Report Status PENDING  Incomplete  Culture, blood (routine x 2)     Status: None (Preliminary result)   Collection Time: 08/31/15  7:00 PM  Result Value Ref Range Status   Specimen Description BLOOD RIGHT HAND  Final   Special Requests BOTTLES DRAWN AEROBIC ONLY 3CC  Final   Culture   Final    NO GROWTH 2 DAYS Performed at [facility]    Report Status PENDING  Incomplete  Blood culture (routine x 2)     Status: None (Preliminary result)   Collection Time: 08/31/15 10:15 PM  Result Value Ref Range Status   Specimen Description BLOOD RIGHT ANTECUBITAL  Final   Special Requests BOTTLES DRAWN AEROBIC AND ANAEROBIC 5CC  Final   Culture   Final    NO GROWTH 1 DAY Performed at [facility]    Report Status PENDING  Incomplete  Blood culture (routine x 2)     Status: None (Preliminary result)   Collection Time: 08/31/15 10:34 PM  Result Value Ref Range Status   Specimen Description BLOOD RIGHT WRIST  Final   Special Requests BOTTLES DRAWN AEROBIC AND ANAEROBIC 5CC  Final   Culture   Final    NO GROWTH 1 DAY Performed at [facility]    Report Status PENDING  Incomplete  MRSA PCR Screening     Status: None   Collection Time: 09/01/15  2:01 AM  Result Value Ref Range Status   MRSA by PCR NEGATIVE NEGATIVE Final    Comment:        The GeneXpert MRSA Assay (FDA approved for NASAL specimens only), is one component of a comprehensive MRSA colonization surveillance program. It is not intended to diagnose MRSA infection nor to guide or monitor treatment for MRSA infections.   Culture, blood (routine x 2)     Status: None (Preliminary result)   Collection Time: 09/02/15 12:55 AM  Result Value Ref Range Status   Specimen Description BLOOD RIGHT HAND  Final   Special Requests IN PEDIATRIC BOTTLE 2CC  Final   Culture NO GROWTH < 12 HOURS  Final   Report Status PENDING  Incomplete  Culture, blood (routine x 2)     Status: None (Preliminary result)     Collection Time: 09/02/15  1:08 AM  Result Value Ref Range Status   Specimen Description BLOOD LEFT ARM  Final   Special Requests BOTTLES DRAWN AEROBIC AND ANAEROBIC  5CC  Final   Culture NO GROWTH < 12 HOURS  Final   Report Status PENDING  Incomplete     ASSESSMENT: His chest x-ray is most compatible with septic pulmonary emboli, probably right-sided endocarditis with blood cultures remain negative. He has terrible dentition and I'm also concerned about possible anaerobic/anaerobic aspiration pneumonia. His transthoracic echocardiogram is pending. I will continue vancomycin and change cefepime to ampicillin sulbactam to improve anaerobic coverage.  PLAN: 1. Await results of TTE 2. Await results final blood cultures 3. Continue vancomycin 4. Change cefepime to ampicillin sulbactam  Cliffton AstersJohn Paiton Fosco, MD [facility] [phone] pager   [phone] cell 09/03/2015, 10:50 AM

## 2015-09-04 ENCOUNTER — Inpatient Hospital Stay (HOSPITAL_COMMUNITY): Payer: Self-pay

## 2015-09-04 LAB — VANCOMYCIN, TROUGH: VANCOMYCIN TR: 14 ug/mL — AB (ref 15–20)

## 2015-09-04 MED ORDER — VANCOMYCIN HCL 10 G IV SOLR
1250.0000 mg | Freq: Three times a day (TID) | INTRAVENOUS | Status: DC
  Administered 2015-09-05 – 2015-09-06 (×4): 1250 mg via INTRAVENOUS
  Filled 2015-09-04 (×6): qty 1250

## 2015-09-04 NOTE — Progress Notes (Signed)
Patient ID: Kenneth Davis, male   DOB: 1990/11/08, 25 y.o.   MRN: [id number]  Patient ID: Kenneth Davis, male   DOB: 1990/11/08, 25 y.o.   MRN: [id number]          [facility]    Date of Admission:  08/31/2015           Day 5 vancomycin        Day 2 ampicillin sulbactam  Principal Problem:   Septic pulmonary embolism (HCC) Active Problems:   Cocaine abuse   Marijuana abuse   Tobacco abuse   IVDU (intravenous drug user)   Sepsis (HCC)   Chest pain   Elevated troponin   Alcohol abuse   GERD (gastroesophageal reflux disease)   Drug abuse   . ampicillin-sulbactam (UNASYN) IV  3 g Intravenous Q6H  . enoxaparin (LOVENOX) injection  40 mg Subcutaneous Q24H  . famotidine  20 mg Oral BID  . folic acid  1 mg Oral Daily  . LORazepam  0-4 mg Intravenous Q12H  . multivitamin with minerals  1 tablet Oral Daily  . nicotine  21 mg Transdermal Daily  . thiamine  100 mg Oral Daily  . vancomycin  1,000 mg Intravenous Q8H    SUBJECTIVE: He is feeling a little bit better today. He is not coughing as much. He is still bringing up foul tasting frothy yellow sputum. He is not having as much chest pain.  Review of Systems: Review of Systems  Constitutional: Positive for chills, diaphoresis, fever and malaise/fatigue.  Respiratory: Positive for cough, sputum production and shortness of breath.   Cardiovascular: Positive for chest pain.  Gastrointestinal: Negative for abdominal pain, diarrhea, nausea and vomiting.  Genitourinary: Negative for dysuria.  Musculoskeletal: Negative for joint pain.  Psychiatric/Behavioral: Positive for substance abuse.    Past Medical History:  Diagnosis Date  . Alcohol abuse   . Cocaine abuse   . GERD (gastroesophageal reflux disease)   . IVDU (intravenous drug user)   . Marijuana abuse   . Tobacco abuse     Social History  Substance Use Topics  . Smoking status: Current Some Day Smoker  . Smokeless tobacco: Never Used  .  Alcohol use Yes    History reviewed. No pertinent family history. No Known Allergies  OBJECTIVE: Vitals:   09/04/15 0608 09/04/15 0732 09/04/15 1158 09/04/15 1516  BP: 116/65 (!) 103/52 (!) 109/55 112/62  Pulse: 96 91 83 81  Resp: (!) 27   17  Temp: (!) 102.4 F (39.1 C) (!) 100.5 F (38.1 C) 99.8 F (37.7 C) 99.1 F (37.3 C)  TempSrc: Oral Oral Oral Oral  SpO2: 95% 96% 95% 96%  Weight: 141 lb 9.6 oz (64.2 kg)     Height:       Body mass index is 20.32 kg/m.  Physical Exam  Constitutional: He is oriented to person, place, and time.  He is very pleasant. He is in no distress resting quietly in bed.  HENT:  Very poor dentition.  Cardiovascular: Normal rate and regular rhythm.   No murmur heard. Pulmonary/Chest: Effort normal and breath sounds normal. He has no wheezes. He has no rales.  Abdominal: Soft. There is no tenderness.  Musculoskeletal: Normal range of motion. He exhibits no edema or tenderness.  Neurological: He is alert and oriented to person, place, and time.  Skin: No rash noted.  Psychiatric: Mood and affect normal.    Lab Results Lab Results  Component Value Date   WBC  13.8 (H) 09/03/2015   HGB 14.3 09/03/2015   HCT 41.8 09/03/2015   MCV 91.5 09/03/2015   PLT 278 09/03/2015    Lab Results  Component Value Date   CREATININE 0.84 09/02/2015   BUN 8 09/02/2015   NA 131 (L) 09/02/2015   K 3.6 09/02/2015   CL 96 (L) 09/02/2015   CO2 27 09/02/2015    Lab Results  Component Value Date   ALT 20 09/02/2015   AST 23 09/02/2015   ALKPHOS 51 09/02/2015   BILITOT 1.3 (H) 09/02/2015     Microbiology: Recent Results (from the past 240 hour(s))  Culture, blood (routine x 2)     Status: None (Preliminary result)   Collection Time: 08/31/15  6:40 PM  Result Value Ref Range Status   Specimen Description BLOOD LEFT ANTECUBITAL  Final   Special Requests BOTTLES DRAWN AEROBIC AND ANAEROBIC 5CC  Final   Culture   Final    NO GROWTH 4 DAYS Performed at  [facility]    Report Status PENDING  Incomplete  Culture, blood (routine x 2)     Status: None (Preliminary result)   Collection Time: 08/31/15  7:00 PM  Result Value Ref Range Status   Specimen Description BLOOD RIGHT HAND  Final   Special Requests BOTTLES DRAWN AEROBIC ONLY 3CC  Final   Culture   Final    NO GROWTH 4 DAYS Performed at [facility]    Report Status PENDING  Incomplete  Blood culture (routine x 2)     Status: None (Preliminary result)   Collection Time: 08/31/15 10:15 PM  Result Value Ref Range Status   Specimen Description BLOOD RIGHT ANTECUBITAL  Final   Special Requests BOTTLES DRAWN AEROBIC AND ANAEROBIC 5CC  Final   Culture   Final    NO GROWTH 3 DAYS Performed at [facility]    Report Status PENDING  Incomplete  Blood culture (routine x 2)     Status: None (Preliminary result)   Collection Time: 08/31/15 10:34 PM  Result Value Ref Range Status   Specimen Description BLOOD RIGHT WRIST  Final   Special Requests BOTTLES DRAWN AEROBIC AND ANAEROBIC 5CC  Final   Culture   Final    NO GROWTH 3 DAYS Performed at [facility]    Report Status PENDING  Incomplete  MRSA PCR Screening     Status: None   Collection Time: 09/01/15  2:01 AM  Result Value Ref Range Status   MRSA by PCR NEGATIVE NEGATIVE Final    Comment:        The GeneXpert MRSA Assay (FDA approved for NASAL specimens only), is one component of a comprehensive MRSA colonization surveillance program. It is not intended to diagnose MRSA infection nor to guide or monitor treatment for MRSA infections.   Culture, blood (routine x 2)     Status: None (Preliminary result)   Collection Time: 09/02/15 12:55 AM  Result Value Ref Range Status   Specimen Description BLOOD RIGHT HAND  Final   Special Requests IN PEDIATRIC BOTTLE 2CC  Final   Culture NO GROWTH 2 DAYS  Final   Report Status PENDING  Incomplete  Culture, blood (routine x 2)     Status: None (Preliminary  result)   Collection Time: 09/02/15  1:08 AM  Result Value Ref Range Status   Specimen Description BLOOD LEFT ARM  Final   Special Requests BOTTLES DRAWN AEROBIC AND ANAEROBIC 5CC  Final   Culture  NO GROWTH 2 DAYS  Final   Report Status PENDING  Incomplete  Culture, blood (single)     Status: None (Preliminary result)   Collection Time: 09/02/15  8:23 PM  Result Value Ref Range Status   Specimen Description BLOOD LEFT HAND  Final   Special Requests IN PEDIATRIC BOTTLE 1CC  Final   Culture NO GROWTH 2 DAYS  Final   Report Status PENDING  Incomplete     ASSESSMENT: He is still having fever and his chest x-ray looks a little worse today but he is beginning to feel better. Blood cultures remain negative. I will continue vancomycin and ampicillin sulbactam pending final cultures. No evidence of endocarditis was found on TTE.  PLAN: 1. Continue current antibiotics pending final cultures  Cliffton Asters, MD [facility] [phone] pager   [phone] cell 09/04/2015, 3:53 PM

## 2015-09-04 NOTE — Progress Notes (Signed)
PROGRESS NOTE        PATIENT DETAILS Name: Kenneth Davis Age: 25 y.o. Sex: male Date of Birth: 1990-12-04 Admit Date: 08/31/2015 Admitting Physician Lorretta HarpXilin Niu, MD PCP:No PCP Per Patient  Brief Narrative: Patient is a 25 y.o. male with history of IV drug use (cocaine), marijuana use presented with almost 1 week history of fever with chills, generalized body aches and headaches and cough. Found to be febrile on initial presentation to the ED, underwent CT scan of the head, chest and abdomen. CT chest was consistent with bilateral septic emboli. CT head did not show any acute abnormalities. Patient was presumed to have infective endocarditis, and admitted to the hospitalist service. Multiple Blood cultures are currently negative so far.ID following and directing Abx therapy  Subjective: Awake and alert-cough slowly improving. Continues to have fever.   Assessment/Plan: Principal Problem: Sepsis secondary to presumed infective endocarditis with septic emboli to the lungs: Sepsis pathophysiology improving, still febrile-continue with empiric antibiotics and IV fluids. Multiple blood cultures on 8/25, 8/27 negative so far. Transthoracic echocardiogram negative for vegetations, spoke with Dr. Gwenlyn Saranampbell-infectious disease-he does not recommend TEE at this time. Will go ahead and repeat a chest x-ray-will await further recommendations from infectious disease.  Active Problems: Chest pain: Atypical as it is mostly bilateral, likley secondary to multiple septic emboli and repeated coughing- did have minimally elevated troponin-trend is flat and not consistent with ACS. Apart from an echo doubt further workup is required.  GERD: Continue Pepcid  Polysubstance abuse: Acknowledges cocaine (claims last use was 3-4 weeks back prior to current admit), marijuana and occasional alcohol use. Continue Ativan per CIWA protocol. Social work consultation prior to discharge for outpatient  substance abuse rehabilitation resources. He has been counseled extensively.  DVT Prophylaxis: Prophylactic Lovenox   Code Status: Full code   Family Communication: None at bedside   Disposition Plan: Remain inpatient-but will likely require home health IV antibiotics on discharge. Will require several more days of hospitalization.  Antimicrobial agents: IV vancomycin 8/25>> IV Unasyn 8/28>> IV cefepime 8/26>>8/28  Procedures: None  CONSULTS:  ID  Time spent: 25 minutes-Greater than 50% of this time was spent in counseling, explanation of diagnosis, planning of further management, and coordination of care.  MEDICATIONS: Anti-infectives    Start     Dose/Rate Route Frequency Ordered Stop   09/03/15 1200  Ampicillin-Sulbactam (UNASYN) 3 g in sodium chloride 0.9 % 100 mL IVPB     3 g 100 mL/hr over 60 Minutes Intravenous Every 6 hours 09/03/15 1120     09/01/15 0600  ceFEPIme (MAXIPIME) 2 g in dextrose 5 % 50 mL IVPB  Status:  Discontinued     2 g 100 mL/hr over 30 Minutes Intravenous Every 8 hours 09/01/15 0252 09/03/15 1055   09/01/15 0600  vancomycin (VANCOCIN) IVPB 1000 mg/200 mL premix     1,000 mg 200 mL/hr over 60 Minutes Intravenous Every 8 hours 09/01/15 0304     08/31/15 2200  vancomycin (VANCOCIN) IVPB 1000 mg/200 mL premix     1,000 mg 200 mL/hr over 60 Minutes Intravenous  Once 08/31/15 2158 09/01/15 0001   08/31/15 2200  cefTRIAXone (ROCEPHIN) 2 g in dextrose 5 % 50 mL IVPB     2 g 100 mL/hr over 30 Minutes Intravenous  Once 08/31/15 2158 09/01/15 0031      Scheduled Meds: .  ampicillin-sulbactam (UNASYN) IV  3 g Intravenous Q6H  . enoxaparin (LOVENOX) injection  40 mg Subcutaneous Q24H  . famotidine  20 mg Oral BID  . folic acid  1 mg Oral Daily  . LORazepam  0-4 mg Intravenous Q12H  . multivitamin with minerals  1 tablet Oral Daily  . nicotine  21 mg Transdermal Daily  . thiamine  100 mg Oral Daily  . vancomycin  1,000 mg Intravenous Q8H    Continuous Infusions: . sodium chloride 1,000 mL (09/02/15 2033)   PRN Meds:.acetaminophen, albuterol, alum & mag hydroxide-simeth, chlorpheniramine-HYDROcodone, guaiFENesin-dextromethorphan, hydrocortisone, hydrocortisone cream, lip balm, loratadine, MUSCLE RUB, nitroGLYCERIN, ondansetron (ZOFRAN) IV, oxyCODONE, phenol, polyvinyl alcohol, sodium chloride   PHYSICAL EXAM: Vital signs: Vitals:   09/04/15 0007 09/04/15 0411 09/04/15 0608 09/04/15 0732  BP: 113/64 (!) 109/48 116/65 (!) 103/52  Pulse: 96 100 96 91  Resp:   (!) 27   Temp: 100.1 F (37.8 C) (!) 100.5 F (38.1 C) (!) 102.4 F (39.1 C) (!) 100.5 F (38.1 C)  TempSrc: Oral Oral Oral Oral  SpO2: 96% 94% 95% 96%  Weight:  64.3 kg (141 lb 11.2 oz) 64.2 kg (141 lb 9.6 oz)   Height:       Filed Weights   09/03/15 0625 09/04/15 0411 09/04/15 0608  Weight: 64 kg (141 lb 1.6 oz) 64.3 kg (141 lb 11.2 oz) 64.2 kg (141 lb 9.6 oz)   Body mass index is 20.32 kg/m.   Gen Exam: Awake and alert with clear speech. Not in any distress Neck: Supple, No JVD.   Chest: B/L Clear.   CVS: S1 S2 Regular-no murmurs Abdomen: soft, BS +, non tender, non distended.  Extremities: no edema, lower extremities warm to touch. Neurologic: Non Focal.   Skin: No Rash or lesions   Wounds: N/A.    I have personally reviewed following labs and imaging studies  LABORATORY DATA: CBC:  Recent Labs Lab 08/31/15 1900 09/02/15 0105 09/03/15 0508  WBC 13.2* 11.7* 13.8*  NEUTROABS 10.2*  --   --   HGB 17.2* 14.6 14.3  HCT 48.2 42.2 41.8  MCV 88.8 91.3 91.5  PLT 155 185 278    Basic Metabolic Panel:  Recent Labs Lab 08/31/15 1900 09/02/15 0105  NA 131* 131*  K 3.9 3.6  CL 94* 96*  CO2 25 27  GLUCOSE 116* 110*  BUN 16 8  CREATININE 0.96 0.84  CALCIUM 8.7* 8.6*    GFR: Estimated Creatinine Clearance: 124.2 mL/min (by C-G formula based on SCr of 0.84 mg/dL).  Liver Function Tests:  Recent Labs Lab 08/31/15 1900 09/02/15 0105   AST 32 23  ALT 23 20  ALKPHOS 61 51  BILITOT 1.9* 1.3*  PROT 8.0 6.3*  ALBUMIN 3.2* 2.3*    Recent Labs Lab 08/31/15 1900  LIPASE 38   No results for input(s): AMMONIA in the last 168 hours.  Coagulation Profile:  Recent Labs Lab 08/31/15 1900  INR 1.10    Cardiac Enzymes:  Recent Labs Lab 08/31/15 1900 09/01/15 0526 09/01/15 1056  TROPONINI 0.29* 0.06* 0.06*    BNP (last 3 results) No results for input(s): PROBNP in the last 8760 hours.  HbA1C: No results for input(s): HGBA1C in the last 72 hours.  CBG:  Recent Labs Lab 08/31/15 1722 09/01/15 2112  GLUCAP 203* 101*    Lipid Profile: No results for input(s): CHOL, HDL, LDLCALC, TRIG, CHOLHDL, LDLDIRECT in the last 72 hours.  Thyroid Function Tests: No results for input(s): TSH,  T4TOTAL, FREET4, T3FREE, THYROIDAB in the last 72 hours.  Anemia Panel: No results for input(s): VITAMINB12, FOLATE, FERRITIN, TIBC, IRON, RETICCTPCT in the last 72 hours.  Urine analysis:    Component Value Date/Time   COLORURINE ORANGE (A) 08/31/2015 1725   APPEARANCEUR CLEAR 08/31/2015 1725   LABSPEC 1.029 08/31/2015 1725   PHURINE 6.0 08/31/2015 1725   GLUCOSEU 100 (A) 08/31/2015 1725   HGBUR NEGATIVE 08/31/2015 1725   BILIRUBINUR SMALL (A) 08/31/2015 1725   KETONESUR 15 (A) 08/31/2015 1725   PROTEINUR 100 (A) 08/31/2015 1725   NITRITE NEGATIVE 08/31/2015 1725   LEUKOCYTESUR TRACE (A) 08/31/2015 1725    Sepsis Labs: Lactic Acid, Venous    Component Value Date/Time   LATICACIDVEN 1.0 09/01/2015 0512    MICROBIOLOGY: Recent Results (from the past 240 hour(s))  Culture, blood (routine x 2)     Status: None (Preliminary result)   Collection Time: 08/31/15  6:40 PM  Result Value Ref Range Status   Specimen Description BLOOD LEFT ANTECUBITAL  Final   Special Requests BOTTLES DRAWN AEROBIC AND ANAEROBIC 5CC  Final   Culture   Final    NO GROWTH 4 DAYS Performed at [facility]    Report Status  PENDING  Incomplete  Culture, blood (routine x 2)     Status: None (Preliminary result)   Collection Time: 08/31/15  7:00 PM  Result Value Ref Range Status   Specimen Description BLOOD RIGHT HAND  Final   Special Requests BOTTLES DRAWN AEROBIC ONLY 3CC  Final   Culture   Final    NO GROWTH 4 DAYS Performed at [facility]    Report Status PENDING  Incomplete  Blood culture (routine x 2)     Status: None (Preliminary result)   Collection Time: 08/31/15 10:15 PM  Result Value Ref Range Status   Specimen Description BLOOD RIGHT ANTECUBITAL  Final   Special Requests BOTTLES DRAWN AEROBIC AND ANAEROBIC 5CC  Final   Culture   Final    NO GROWTH 3 DAYS Performed at [facility]    Report Status PENDING  Incomplete  Blood culture (routine x 2)     Status: None (Preliminary result)   Collection Time: 08/31/15 10:34 PM  Result Value Ref Range Status   Specimen Description BLOOD RIGHT WRIST  Final   Special Requests BOTTLES DRAWN AEROBIC AND ANAEROBIC 5CC  Final   Culture   Final    NO GROWTH 3 DAYS Performed at [facility]    Report Status PENDING  Incomplete  MRSA PCR Screening     Status: None   Collection Time: 09/01/15  2:01 AM  Result Value Ref Range Status   MRSA by PCR NEGATIVE NEGATIVE Final    Comment:        The GeneXpert MRSA Assay (FDA approved for NASAL specimens only), is one component of a comprehensive MRSA colonization surveillance program. It is not intended to diagnose MRSA infection nor to guide or monitor treatment for MRSA infections.   Culture, blood (routine x 2)     Status: None (Preliminary result)   Collection Time: 09/02/15 12:55 AM  Result Value Ref Range Status   Specimen Description BLOOD RIGHT HAND  Final   Special Requests IN PEDIATRIC BOTTLE 2CC  Final   Culture NO GROWTH 2 DAYS  Final   Report Status PENDING  Incomplete  Culture, blood (routine x 2)     Status: None (Preliminary result)   Collection Time: 09/02/15  1:08 AM  Result Value Ref Range Status   Specimen Description BLOOD LEFT ARM  Final   Special Requests BOTTLES DRAWN AEROBIC AND ANAEROBIC 5CC  Final   Culture NO GROWTH 2 DAYS  Final   Report Status PENDING  Incomplete  Culture, blood (single)     Status: None (Preliminary result)   Collection Time: 09/02/15  8:23 PM  Result Value Ref Range Status   Specimen Description BLOOD LEFT HAND  Final   Special Requests IN PEDIATRIC BOTTLE 1CC  Final   Culture NO GROWTH 2 DAYS  Final   Report Status PENDING  Incomplete    RADIOLOGY STUDIES/RESULTS: Dg Chest 2 View  Result Date: 08/31/2015 CLINICAL DATA:  Right-sided chest pain. EXAM: CHEST  2 VIEW COMPARISON:  None. FINDINGS: Mild right upper lobe airspace opacity. No other evidence of focal consolidation. No pulmonary edema. Normal cardiomediastinal contours. No pleural effusion or pneumothorax. IMPRESSION: Mild right upper lobe airspace opacity could indicate developing infection. Otherwise normal chest radiograph. Electronically Signed   By: Deatra Robinson M.D.   On: 08/31/2015 19:16   Ct Head Wo Contrast  Result Date: 08/31/2015 CLINICAL DATA:  Patient was drinking and doing drugs last night and now complains of chest pain radiating to the mid abdomen. Headache. EXAM: CT HEAD WITHOUT CONTRAST TECHNIQUE: Contiguous axial images were obtained from the base of the skull through the vertex without intravenous contrast. COMPARISON:  06/21/2008 FINDINGS: Brain: Examination technically limited due to motion artifact. Ventricles and sulci appear symmetrical. No ventricular dilatation. No mass effect or midline shift. No abnormal extra-axial fluid collections. Gray-white matter junctions are distinct. Basal cisterns are not effaced. No evidence of acute intracranial hemorrhage. Vascular: No hyperdense vessel or unexpected calcification. Skull: No depressed skull fractures. Sinuses/Orbits: No acute finding. Other: No significant changes since previous study.  IMPRESSION: No acute intracranial abnormalities. Electronically Signed   By: Burman Nieves M.D.   On: 08/31/2015 21:22   Ct Angio Chest Aorta W/cm &/or Wo/cm  Result Date: 08/31/2015 CLINICAL DATA:  Chest pain radiating to the abdomen. History of IV drug use. EXAM: CT ANGIOGRAPHY CHEST, ABDOMEN AND PELVIS TECHNIQUE: Multidetector CT imaging through the chest, abdomen and pelvis was performed using the standard protocol during bolus administration of intravenous contrast. Multiplanar reconstructed images and MIPs were obtained and reviewed to evaluate the vascular anatomy. CONTRAST:  100 cc Isovue 370 IV COMPARISON:  Chest radiograph earlier this day. FINDINGS: CTA CHEST FINDINGS Cardiovascular: No aortic dissection, aneurysm, hematoma, or acute aortic syndrome. Thoracic aorta is normal in caliber. Heart is normal in size. No filling defects in the central pulmonary arteries. Mediastinum/nodes: Fluid abutting the right mediastinal border measuring up to 3.4 cm may be a loculated pleural effusion versus less likely pericardial effusion. This is heterogeneous fluid density with minimal peripheral but no internal enhancement. Enlarged right hilar node measures 14 mm. Small prevascular nodes. There is a prominent subcarinal node. Probable epicardial node. Lungs/pleura: Multifocal pulmonary nodules in a pattern consistent with septic emboli/infection. These are bilateral and in falls all lobes of both lungs. Greatest burden is in the upper lobes were there is probable early cavitation. Nodule conglomerate in the upper lobe measures 3.4 x 1.9 cm. Within the medial right lung base his consolidation with central air, suspicious for necrotizing infection/ pneumonia. There is a partially loculated right pleural effusion measuring up to 2 cm, some peripheral enhancement. Trace left pleural effusion. Musculoskeletal: There are no acute or suspicious osseous abnormalities. Review of the MIP images confirms  the above  findings. CTA ABDOMEN AND PELVIS FINDINGS Aorta/vascular: Abdominal aorta is normal in caliber. There is no dissection, aneurysm, or acute aortic syndrome. Normal variant anatomy with small likely a accessory hepatic Ing gastric artery arising directly from the aorta. Celiac, superior mesenteric, and inferior mesenteric arteries are patent. Single bilateral renal arteries are patent. Normal caliber bilateral common and external iliac arteries. Liver: Normal for arterial phase imaging. Hepatobiliary: Gallbladder physiologically distended, no calcified stone. No biliary dilatation. Pancreas: No ductal dilatation or inflammation. Spleen: Normal for arterial phase imaging. Adrenal glands: No nodule. Kidneys: Symmetric renal enhancement. No hydronephrosis. No perinephric edema. Stomach/Bowel: Stomach physiologically distended. There are no dilated or thickened small bowel loops. Small volume of stool throughout the colon without colonic wall thickening. The appendix is normal. Vascular/Lymphatic: No retroperitoneal, mesenteric, or pelvic adenopathy. Reproductive: No acute abnormality. Bladder: Physiologically distended without wall thickening. Other: No free air, free fluid, or intra-abdominal fluid collection. Musculoskeletal: There are no acute or suspicious osseous abnormalities. Review of the MIP images confirms the above findings. IMPRESSION: 1. Findings consistent with bilateral septic emboli involving all lobes of both lungs. There is evidence of central necrosis involving right upper and lower lobe lesions. Partially loculated right pleural effusion. Fluid adjacent of right heart border is likely loculated pleural fluid versus less likely pericardial fluid. Mediastinal and right hilar adenopathy is likely reactive. 2. No aortic dissection. 3. No acute abnormality in the abdomen/pelvis. These results were called by telephone at the time of interpretation on 08/31/2015 at 10:04 pm to Dr. Arby Barrette , who  verbally acknowledged these results. Electronically Signed   By: Rubye Oaks M.D.   On: 08/31/2015 22:05   Ct Angio Abd/pel W/ And/or W/o  Result Date: 08/31/2015 CLINICAL DATA:  Chest pain radiating to the abdomen. History of IV drug use. EXAM: CT ANGIOGRAPHY CHEST, ABDOMEN AND PELVIS TECHNIQUE: Multidetector CT imaging through the chest, abdomen and pelvis was performed using the standard protocol during bolus administration of intravenous contrast. Multiplanar reconstructed images and MIPs were obtained and reviewed to evaluate the vascular anatomy. CONTRAST:  100 cc Isovue 370 IV COMPARISON:  Chest radiograph earlier this day. FINDINGS: CTA CHEST FINDINGS Cardiovascular: No aortic dissection, aneurysm, hematoma, or acute aortic syndrome. Thoracic aorta is normal in caliber. Heart is normal in size. No filling defects in the central pulmonary arteries. Mediastinum/nodes: Fluid abutting the right mediastinal border measuring up to 3.4 cm may be a loculated pleural effusion versus less likely pericardial effusion. This is heterogeneous fluid density with minimal peripheral but no internal enhancement. Enlarged right hilar node measures 14 mm. Small prevascular nodes. There is a prominent subcarinal node. Probable epicardial node. Lungs/pleura: Multifocal pulmonary nodules in a pattern consistent with septic emboli/infection. These are bilateral and in falls all lobes of both lungs. Greatest burden is in the upper lobes were there is probable early cavitation. Nodule conglomerate in the upper lobe measures 3.4 x 1.9 cm. Within the medial right lung base his consolidation with central air, suspicious for necrotizing infection/ pneumonia. There is a partially loculated right pleural effusion measuring up to 2 cm, some peripheral enhancement. Trace left pleural effusion. Musculoskeletal: There are no acute or suspicious osseous abnormalities. Review of the MIP images confirms the above findings. CTA ABDOMEN  AND PELVIS FINDINGS Aorta/vascular: Abdominal aorta is normal in caliber. There is no dissection, aneurysm, or acute aortic syndrome. Normal variant anatomy with small likely a accessory hepatic Ing gastric artery arising directly from the aorta. Celiac, superior mesenteric, and  inferior mesenteric arteries are patent. Single bilateral renal arteries are patent. Normal caliber bilateral common and external iliac arteries. Liver: Normal for arterial phase imaging. Hepatobiliary: Gallbladder physiologically distended, no calcified stone. No biliary dilatation. Pancreas: No ductal dilatation or inflammation. Spleen: Normal for arterial phase imaging. Adrenal glands: No nodule. Kidneys: Symmetric renal enhancement. No hydronephrosis. No perinephric edema. Stomach/Bowel: Stomach physiologically distended. There are no dilated or thickened small bowel loops. Small volume of stool throughout the colon without colonic wall thickening. The appendix is normal. Vascular/Lymphatic: No retroperitoneal, mesenteric, or pelvic adenopathy. Reproductive: No acute abnormality. Bladder: Physiologically distended without wall thickening. Other: No free air, free fluid, or intra-abdominal fluid collection. Musculoskeletal: There are no acute or suspicious osseous abnormalities. Review of the MIP images confirms the above findings. IMPRESSION: 1. Findings consistent with bilateral septic emboli involving all lobes of both lungs. There is evidence of central necrosis involving right upper and lower lobe lesions. Partially loculated right pleural effusion. Fluid adjacent of right heart border is likely loculated pleural fluid versus less likely pericardial fluid. Mediastinal and right hilar adenopathy is likely reactive. 2. No aortic dissection. 3. No acute abnormality in the abdomen/pelvis. These results were called by telephone at the time of interpretation on 08/31/2015 at 10:04 pm to Dr. Arby Barrette , who verbally acknowledged these  results. Electronically Signed   By: Rubye Oaks M.D.   On: 08/31/2015 22:05     LOS: 3 days   Jeoffrey Massed, MD  [facility] Pager:[phone]  If 7PM-7AM, please contact night-coverage [email or web address] Password [facility] 09/04/2015, 11:32 AM

## 2015-09-04 NOTE — Progress Notes (Signed)
Pharmacy Antibiotic Note  Kenneth Davis is a 25 y.o. male admitted on 08/31/2015 with septic emboli, IVDU.  Pharmacy has been consulted for Vancomycin dosing.  Vanc trough below goal this evening and pt with continued significant Temps.  Will adjust dosing for goal 15-20 trough.  Plan: Increase Vancomycin to 1250mg  IV q8, next dose 6am Watch renal fxn, f/u cx results Repeat trough at steady state  Height: 5\' 10"  (177.8 cm) Weight: 141 lb 9.6 oz (64.2 kg) IBW/kg (Calculated) : 73  Temp (24hrs), Avg:100.5 F (38.1 C), Min:99.1 F (37.3 C), Max:102.4 F (39.1 C)   Recent Labs Lab 08/31/15 1900 08/31/15 1917 09/01/15 0512 09/02/15 0105 09/02/15 2023 09/03/15 0508 09/04/15 2107  WBC 13.2*  --   --  11.7*  --  13.8*  --   CREATININE 0.96  --   --  0.84  --   --   --   LATICACIDVEN  --  1.76 1.0  --   --   --   --   VANCOTROUGH  --   --   --   --  15  --  14*    Estimated Creatinine Clearance: 124.2 mL/min (by C-G formula based on SCr of 0.84 mg/dL).    No Known Allergies  Antimicrobials this admission: Vanc 8/25 >> Unasyn 8/28 >> Cefepime 8/26>>8/28 Rocephin x 1 dose 8/25  Dose adjustments this admission: 8/27 VT = 15 -> no change in dose 8/29  VT = 14  Microbiology results: 8/26 HIV: neg 8/26 HCV: neg 8/26 MRSA PCR Neg 8/25 BC x 4>>NGTD 8/27 BCx: NGTD  Thank you for allowing pharmacy to be a part of this patient's care.  Kenneth Davis, PharmD Clinical Pharmacist [facility]

## 2015-09-05 LAB — CULTURE, BLOOD (ROUTINE X 2)
Culture: NO GROWTH
Culture: NO GROWTH

## 2015-09-05 LAB — BASIC METABOLIC PANEL
Anion gap: 11 (ref 5–15)
CALCIUM: 8.5 mg/dL — AB (ref 8.9–10.3)
CO2: 27 mmol/L (ref 22–32)
CREATININE: 0.76 mg/dL (ref 0.61–1.24)
Chloride: 96 mmol/L — ABNORMAL LOW (ref 101–111)
Glucose, Bld: 90 mg/dL (ref 65–99)
Potassium: 4 mmol/L (ref 3.5–5.1)
SODIUM: 134 mmol/L — AB (ref 135–145)

## 2015-09-05 NOTE — Clinical Social Work Note (Signed)
Clinical Social Work Assessment  Patient Details  Name: Kenneth Davis MRN: [id number] Date of Birth: 1990/07/25  Date of referral:  09/01/15               Reason for consult:  Substance Use/ETOH Abuse                Permission sought to share information with:    Permission granted to share information::  No  Name::        Agency::     Relationship::     Contact Information:     Housing/Transportation Living arrangements for the past 2 months:  Single Family Home Source of Information:  Patient, Medical Team Patient Interpreter Needed:  None Criminal Activity/Legal Involvement Pertinent to Current Situation/Hospitalization:  No - Comment as needed Significant Relationships:  Other Family Members (Aunt) Lives with:    Do you feel safe going back to the place where you live?  Yes Need for family participation in patient care:  Yes (Comment)  Care giving concerns:  History of substance abuse.   Social Worker assessment / plan:  CSW met with patient. No supports at bedside but stated that his aunt would be here soon. CSW introduced role and inquired about need for substance abuse resources. Patient declined, stating that he recently quit IV drug use but was partying the night before he was admitted. CSW asked if the resources could be left anyone and patient said yes. CSW told him that the resources were for both outpatient and residential substance abuse treatment. CSW encouraged patient to contact CSW if any other resources were needed. No further concerns. CSW will continue to follow for possible SNF need for IV antibiotics.  Employment status:  Unemployed Forensic scientist:  Self Pay (Medicaid Pending) PT Recommendations:  Not assessed at this time Information / Referral to community resources:  Residential Substance Abuse Treatment Options, Outpatient Substance Abuse Treatment Options  Patient/Family's Response to care:  Patient declined need for substance abuse resources but  was agreeable to CSW leaving them anyway. Patient's aunt supportive and involved in patient's care. Patient appreciated social work intervention.  Patient/Family's Understanding of and Emotional Response to Diagnosis, Current Treatment, and Prognosis:  Patient understands consult for substance abuse resources. He appears happy with hospital care.  Emotional Assessment Appearance:  Appears stated age Attitude/Demeanor/Rapport:  Other (Pleasant) Affect (typically observed):  Appropriate, Calm, Pleasant Orientation:  Oriented to Self, Oriented to Place, Oriented to  Time, Oriented to Situation Alcohol / Substance use:  Illicit Drugs, Alcohol Use, Tobacco Use Psych involvement (Current and /or in the community):  No (Comment)  Discharge Needs  Concerns to be addressed:  No discharge needs identified Readmission within the last 30 days:  No Current discharge risk:  Inadequate Financial Supports, Substance Abuse Barriers to Discharge:  Active Substance Use   Candie Chroman, LCSW 09/05/2015, 4:17 PM

## 2015-09-05 NOTE — Progress Notes (Signed)
PROGRESS NOTE        PATIENT DETAILS Name: Kenneth Davis Age: 25 y.o. Sex: male Date of Birth: February 16, 1990 Admit Date: 08/31/2015 Admitting Physician Lorretta Harp, MD PCP:No PCP Per Patient  Brief Narrative: Patient is a 25 y.o. male with history of IV drug use (cocaine), marijuana use presented with almost 1 week history of fever with chills, generalized body aches and headaches and cough. Found to be febrile on initial presentation to the ED, underwent CT scan of the head, chest and abdomen. CT chest was consistent with bilateral septic emboli. CT head did not show any acute abnormalities. Patient was presumed to have infective endocarditis, and admitted to the hospitalist service. Multiple Blood cultures are currently negative so far.ID following and directing Abx therapy  Subjective: Awake and alert-fever curve and cough are slowly improving.   Assessment/Plan: Principal Problem: Sepsis secondary to presumed infective endocarditis with septic emboli to the lungs: Sepsis pathophysiology has resolved, although still febrile-fever curve is improving-continue with empiric antibiotics. Multiple blood cultures on 8/25, 8/27 negative so far. Transthoracic echocardiogram negative for vegetations, spoke with Dr. Gwenlyn Saran disease-he does not recommend TEE at this time. Await further recommendations from infectious disease.  Active Problems: Chest pain: Atypical as it is mostly bilateral, likley secondary to multiple septic emboli and repeated coughing- did have minimally elevated troponin-trend is flat and not consistent with ACS. Echocardiogram EF 60-65%, with no regional wall motion abnormalities. Suspect no further workup is required  GERD: Continue Pepcid  Polysubstance abuse: Acknowledges cocaine (claims last use was 3-4 weeks back prior to current admit), marijuana and occasional alcohol use.  Social work consultation prior to discharge for outpatient  substance abuse rehabilitation resources. He has been counseled extensively.  DVT Prophylaxis: Prophylactic Lovenox   Code Status: Full code   Family Communication: None at bedside   Disposition Plan: Remain inpatient-but will likely require home health IV antibiotics on discharge. Will require several more days of hospitalization.  Antimicrobial agents: IV vancomycin 8/25>> IV Unasyn 8/28>> IV cefepime 8/26>>8/28  Procedures: None  CONSULTS:  ID  Time spent: 25 minutes-Greater than 50% of this time was spent in counseling, explanation of diagnosis, planning of further management, and coordination of care.  MEDICATIONS: Anti-infectives    Start     Dose/Rate Route Frequency Ordered Stop   09/05/15 0600  vancomycin (VANCOCIN) 1,250 mg in sodium chloride 0.9 % 250 mL IVPB     1,250 mg 166.7 mL/hr over 90 Minutes Intravenous Every 8 hours 09/04/15 2244     09/03/15 1200  Ampicillin-Sulbactam (UNASYN) 3 g in sodium chloride 0.9 % 100 mL IVPB     3 g 100 mL/hr over 60 Minutes Intravenous Every 6 hours 09/03/15 1120     09/01/15 0600  ceFEPIme (MAXIPIME) 2 g in dextrose 5 % 50 mL IVPB  Status:  Discontinued     2 g 100 mL/hr over 30 Minutes Intravenous Every 8 hours 09/01/15 0252 09/03/15 1055   09/01/15 0600  vancomycin (VANCOCIN) IVPB 1000 mg/200 mL premix  Status:  Discontinued     1,000 mg 200 mL/hr over 60 Minutes Intravenous Every 8 hours 09/01/15 0304 09/04/15 2244   08/31/15 2200  vancomycin (VANCOCIN) IVPB 1000 mg/200 mL premix     1,000 mg 200 mL/hr over 60 Minutes Intravenous  Once 08/31/15 2158 09/01/15 0001   08/31/15 2200  cefTRIAXone (  ROCEPHIN) 2 g in dextrose 5 % 50 mL IVPB     2 g 100 mL/hr over 30 Minutes Intravenous  Once 08/31/15 2158 09/01/15 0031      Scheduled Meds: . ampicillin-sulbactam (UNASYN) IV  3 g Intravenous Q6H  . enoxaparin (LOVENOX) injection  40 mg Subcutaneous Q24H  . famotidine  20 mg Oral BID  . folic acid  1 mg Oral Daily  .  multivitamin with minerals  1 tablet Oral Daily  . nicotine  21 mg Transdermal Daily  . thiamine  100 mg Oral Daily  . vancomycin  1,250 mg Intravenous Q8H   Continuous Infusions: . sodium chloride 1,000 mL (09/05/15 0000)   PRN Meds:.acetaminophen, albuterol, alum & mag hydroxide-simeth, chlorpheniramine-HYDROcodone, guaiFENesin-dextromethorphan, hydrocortisone, hydrocortisone cream, lip balm, loratadine, MUSCLE RUB, nitroGLYCERIN, ondansetron (ZOFRAN) IV, oxyCODONE, phenol, polyvinyl alcohol, sodium chloride   PHYSICAL EXAM: Vital signs: Vitals:   09/05/15 0006 09/05/15 0357 09/05/15 0815 09/05/15 1230  BP: (!) 114/56 116/61 122/74 108/64  Pulse: 80 92 98 (!) 103  Resp: (!) 22 20 (!) 24 20  Temp: 98.6 F (37 C) 99.1 F (37.3 C) 99.5 F (37.5 C) 99 F (37.2 C)  TempSrc: Oral Oral Oral Oral  SpO2: 99% 95% 95% 95%  Weight:  63.3 kg (139 lb 9.6 oz)    Height:       Filed Weights   09/04/15 0411 09/04/15 0608 09/05/15 0357  Weight: 64.3 kg (141 lb 11.2 oz) 64.2 kg (141 lb 9.6 oz) 63.3 kg (139 lb 9.6 oz)   Body mass index is 20.03 kg/m.   Gen Exam: Awake and alert with clear speech. Not in any distress Neck: Supple, No JVD.   Chest: B/L Clear.   CVS: S1 S2 Regular-no murmurs Abdomen: soft, BS +, non tender, non distended.  Extremities: no edema, lower extremities warm to touch. Neurologic: Non Focal.   Skin: No Rash or lesions   Wounds: N/A.    I have personally reviewed following labs and imaging studies  LABORATORY DATA: CBC:  Recent Labs Lab 08/31/15 1900 09/02/15 0105 09/03/15 0508  WBC 13.2* 11.7* 13.8*  NEUTROABS 10.2*  --   --   HGB 17.2* 14.6 14.3  HCT 48.2 42.2 41.8  MCV 88.8 91.3 91.5  PLT 155 185 278    Basic Metabolic Panel:  Recent Labs Lab 08/31/15 1900 09/02/15 0105 09/05/15 0336  NA 131* 131* 134*  K 3.9 3.6 4.0  CL 94* 96* 96*  CO2 25 27 27   GLUCOSE 116* 110* 90  BUN 16 8 <5*  CREATININE 0.96 0.84 0.76  CALCIUM 8.7* 8.6* 8.5*      GFR: Estimated Creatinine Clearance: 128.6 mL/min (by C-G formula based on SCr of 0.8 mg/dL).  Liver Function Tests:  Recent Labs Lab 08/31/15 1900 09/02/15 0105  AST 32 23  ALT 23 20  ALKPHOS 61 51  BILITOT 1.9* 1.3*  PROT 8.0 6.3*  ALBUMIN 3.2* 2.3*    Recent Labs Lab 08/31/15 1900  LIPASE 38   No results for input(s): AMMONIA in the last 168 hours.  Coagulation Profile:  Recent Labs Lab 08/31/15 1900  INR 1.10    Cardiac Enzymes:  Recent Labs Lab 08/31/15 1900 09/01/15 0526 09/01/15 1056  TROPONINI 0.29* 0.06* 0.06*    BNP (last 3 results) No results for input(s): PROBNP in the last 8760 hours.  HbA1C: No results for input(s): HGBA1C in the last 72 hours.  CBG:  Recent Labs Lab 08/31/15 1722 09/01/15 2112  GLUCAP 203* 101*    Lipid Profile: No results for input(s): CHOL, HDL, LDLCALC, TRIG, CHOLHDL, LDLDIRECT in the last 72 hours.  Thyroid Function Tests: No results for input(s): TSH, T4TOTAL, FREET4, T3FREE, THYROIDAB in the last 72 hours.  Anemia Panel: No results for input(s): VITAMINB12, FOLATE, FERRITIN, TIBC, IRON, RETICCTPCT in the last 72 hours.  Urine analysis:    Component Value Date/Time   COLORURINE ORANGE (A) 08/31/2015 1725   APPEARANCEUR CLEAR 08/31/2015 1725   LABSPEC 1.029 08/31/2015 1725   PHURINE 6.0 08/31/2015 1725   GLUCOSEU 100 (A) 08/31/2015 1725   HGBUR NEGATIVE 08/31/2015 1725   BILIRUBINUR SMALL (A) 08/31/2015 1725   KETONESUR 15 (A) 08/31/2015 1725   PROTEINUR 100 (A) 08/31/2015 1725   NITRITE NEGATIVE 08/31/2015 1725   LEUKOCYTESUR TRACE (A) 08/31/2015 1725    Sepsis Labs: Lactic Acid, Venous    Component Value Date/Time   LATICACIDVEN 1.0 09/01/2015 0512    MICROBIOLOGY: Recent Results (from the past 240 hour(s))  Culture, blood (routine x 2)     Status: None   Collection Time: 08/31/15  6:40 PM  Result Value Ref Range Status   Specimen Description BLOOD LEFT ANTECUBITAL  Final    Special Requests BOTTLES DRAWN AEROBIC AND ANAEROBIC 5CC  Final   Culture   Final    NO GROWTH 5 DAYS Performed at [facility]    Report Status 09/05/2015 FINAL  Final  Culture, blood (routine x 2)     Status: None   Collection Time: 08/31/15  7:00 PM  Result Value Ref Range Status   Specimen Description BLOOD RIGHT HAND  Final   Special Requests BOTTLES DRAWN AEROBIC ONLY 3CC  Final   Culture   Final    NO GROWTH 5 DAYS Performed at [facility]    Report Status 09/05/2015 FINAL  Final  Blood culture (routine x 2)     Status: None (Preliminary result)   Collection Time: 08/31/15 10:15 PM  Result Value Ref Range Status   Specimen Description BLOOD RIGHT ANTECUBITAL  Final   Special Requests BOTTLES DRAWN AEROBIC AND ANAEROBIC 5CC  Final   Culture   Final    NO GROWTH 4 DAYS Performed at [facility]    Report Status PENDING  Incomplete  Blood culture (routine x 2)     Status: None (Preliminary result)   Collection Time: 08/31/15 10:34 PM  Result Value Ref Range Status   Specimen Description BLOOD RIGHT WRIST  Final   Special Requests BOTTLES DRAWN AEROBIC AND ANAEROBIC 5CC  Final   Culture   Final    NO GROWTH 4 DAYS Performed at [facility]    Report Status PENDING  Incomplete  MRSA PCR Screening     Status: None   Collection Time: 09/01/15  2:01 AM  Result Value Ref Range Status   MRSA by PCR NEGATIVE NEGATIVE Final    Comment:        The GeneXpert MRSA Assay (FDA approved for NASAL specimens only), is one component of a comprehensive MRSA colonization surveillance program. It is not intended to diagnose MRSA infection nor to guide or monitor treatment for MRSA infections.   Culture, blood (routine x 2)     Status: None (Preliminary result)   Collection Time: 09/02/15 12:55 AM  Result Value Ref Range Status   Specimen Description BLOOD RIGHT HAND  Final   Special Requests IN PEDIATRIC BOTTLE 2CC  Final   Culture NO GROWTH 3  DAYS   Final   Report Status PENDING  Incomplete  Culture, blood (routine x 2)     Status: None (Preliminary result)   Collection Time: 09/02/15  1:08 AM  Result Value Ref Range Status   Specimen Description BLOOD LEFT ARM  Final   Special Requests BOTTLES DRAWN AEROBIC AND ANAEROBIC 5CC  Final   Culture NO GROWTH 3 DAYS  Final   Report Status PENDING  Incomplete  Culture, blood (single)     Status: None (Preliminary result)   Collection Time: 09/02/15  8:23 PM  Result Value Ref Range Status   Specimen Description BLOOD LEFT HAND  Final   Special Requests IN PEDIATRIC BOTTLE 1CC  Final   Culture NO GROWTH 3 DAYS  Final   Report Status PENDING  Incomplete    RADIOLOGY STUDIES/RESULTS: Dg Chest 2 View  Result Date: 09/04/2015 CLINICAL DATA:  Cough and shortness of breath for 4 days. Recent diagnosis of pneumonia. EXAM: CHEST  2 VIEW COMPARISON:  CT chest and PA and lateral chest 08/31/2015. FINDINGS: There has been interval worsening of right lower lobe airspace disease. New patchy airspace disease is seen in the left lung base. Small bilateral pleural effusions, greater on the left, appear increased in size. Patchy airspace opacity in the right upper lobe is unchanged. IMPRESSION: Increased right greater than left basilar airspace disease. Right upper lobe opacity is not notably changed. Increased small bilateral pleural effusions, greater on the right. Electronically Signed   By: Drusilla Kanner M.D.   On: 09/04/2015 13:06   Dg Chest 2 View  Result Date: 08/31/2015 CLINICAL DATA:  Right-sided chest pain. EXAM: CHEST  2 VIEW COMPARISON:  None. FINDINGS: Mild right upper lobe airspace opacity. No other evidence of focal consolidation. No pulmonary edema. Normal cardiomediastinal contours. No pleural effusion or pneumothorax. IMPRESSION: Mild right upper lobe airspace opacity could indicate developing infection. Otherwise normal chest radiograph. Electronically Signed   By: Deatra Robinson M.D.   On:  08/31/2015 19:16   Ct Head Wo Contrast  Result Date: 08/31/2015 CLINICAL DATA:  Patient was drinking and doing drugs last night and now complains of chest pain radiating to the mid abdomen. Headache. EXAM: CT HEAD WITHOUT CONTRAST TECHNIQUE: Contiguous axial images were obtained from the base of the skull through the vertex without intravenous contrast. COMPARISON:  06/21/2008 FINDINGS: Brain: Examination technically limited due to motion artifact. Ventricles and sulci appear symmetrical. No ventricular dilatation. No mass effect or midline shift. No abnormal extra-axial fluid collections. Gray-white matter junctions are distinct. Basal cisterns are not effaced. No evidence of acute intracranial hemorrhage. Vascular: No hyperdense vessel or unexpected calcification. Skull: No depressed skull fractures. Sinuses/Orbits: No acute finding. Other: No significant changes since previous study. IMPRESSION: No acute intracranial abnormalities. Electronically Signed   By: Burman Nieves M.D.   On: 08/31/2015 21:22   Ct Angio Chest Aorta W/cm &/or Wo/cm  Result Date: 08/31/2015 CLINICAL DATA:  Chest pain radiating to the abdomen. History of IV drug use. EXAM: CT ANGIOGRAPHY CHEST, ABDOMEN AND PELVIS TECHNIQUE: Multidetector CT imaging through the chest, abdomen and pelvis was performed using the standard protocol during bolus administration of intravenous contrast. Multiplanar reconstructed images and MIPs were obtained and reviewed to evaluate the vascular anatomy. CONTRAST:  100 cc Isovue 370 IV COMPARISON:  Chest radiograph earlier this day. FINDINGS: CTA CHEST FINDINGS Cardiovascular: No aortic dissection, aneurysm, hematoma, or acute aortic syndrome. Thoracic aorta is normal in caliber. Heart is normal in size. No filling defects  in the central pulmonary arteries. Mediastinum/nodes: Fluid abutting the right mediastinal border measuring up to 3.4 cm may be a loculated pleural effusion versus less likely  pericardial effusion. This is heterogeneous fluid density with minimal peripheral but no internal enhancement. Enlarged right hilar node measures 14 mm. Small prevascular nodes. There is a prominent subcarinal node. Probable epicardial node. Lungs/pleura: Multifocal pulmonary nodules in a pattern consistent with septic emboli/infection. These are bilateral and in falls all lobes of both lungs. Greatest burden is in the upper lobes were there is probable early cavitation. Nodule conglomerate in the upper lobe measures 3.4 x 1.9 cm. Within the medial right lung base his consolidation with central air, suspicious for necrotizing infection/ pneumonia. There is a partially loculated right pleural effusion measuring up to 2 cm, some peripheral enhancement. Trace left pleural effusion. Musculoskeletal: There are no acute or suspicious osseous abnormalities. Review of the MIP images confirms the above findings. CTA ABDOMEN AND PELVIS FINDINGS Aorta/vascular: Abdominal aorta is normal in caliber. There is no dissection, aneurysm, or acute aortic syndrome. Normal variant anatomy with small likely a accessory hepatic Ing gastric artery arising directly from the aorta. Celiac, superior mesenteric, and inferior mesenteric arteries are patent. Single bilateral renal arteries are patent. Normal caliber bilateral common and external iliac arteries. Liver: Normal for arterial phase imaging. Hepatobiliary: Gallbladder physiologically distended, no calcified stone. No biliary dilatation. Pancreas: No ductal dilatation or inflammation. Spleen: Normal for arterial phase imaging. Adrenal glands: No nodule. Kidneys: Symmetric renal enhancement. No hydronephrosis. No perinephric edema. Stomach/Bowel: Stomach physiologically distended. There are no dilated or thickened small bowel loops. Small volume of stool throughout the colon without colonic wall thickening. The appendix is normal. Vascular/Lymphatic: No retroperitoneal, mesenteric, or  pelvic adenopathy. Reproductive: No acute abnormality. Bladder: Physiologically distended without wall thickening. Other: No free air, free fluid, or intra-abdominal fluid collection. Musculoskeletal: There are no acute or suspicious osseous abnormalities. Review of the MIP images confirms the above findings. IMPRESSION: 1. Findings consistent with bilateral septic emboli involving all lobes of both lungs. There is evidence of central necrosis involving right upper and lower lobe lesions. Partially loculated right pleural effusion. Fluid adjacent of right heart border is likely loculated pleural fluid versus less likely pericardial fluid. Mediastinal and right hilar adenopathy is likely reactive. 2. No aortic dissection. 3. No acute abnormality in the abdomen/pelvis. These results were called by telephone at the time of interpretation on 08/31/2015 at 10:04 pm to Dr. Arby BarretteMARCY PFEIFFER , who verbally acknowledged these results. Electronically Signed   By: Rubye OaksMelanie  Ehinger M.D.   On: 08/31/2015 22:05   Ct Angio Abd/pel W/ And/or W/o  Result Date: 08/31/2015 CLINICAL DATA:  Chest pain radiating to the abdomen. History of IV drug use. EXAM: CT ANGIOGRAPHY CHEST, ABDOMEN AND PELVIS TECHNIQUE: Multidetector CT imaging through the chest, abdomen and pelvis was performed using the standard protocol during bolus administration of intravenous contrast. Multiplanar reconstructed images and MIPs were obtained and reviewed to evaluate the vascular anatomy. CONTRAST:  100 cc Isovue 370 IV COMPARISON:  Chest radiograph earlier this day. FINDINGS: CTA CHEST FINDINGS Cardiovascular: No aortic dissection, aneurysm, hematoma, or acute aortic syndrome. Thoracic aorta is normal in caliber. Heart is normal in size. No filling defects in the central pulmonary arteries. Mediastinum/nodes: Fluid abutting the right mediastinal border measuring up to 3.4 cm may be a loculated pleural effusion versus less likely pericardial effusion. This is  heterogeneous fluid density with minimal peripheral but no internal enhancement. Enlarged right hilar node measures  14 mm. Small prevascular nodes. There is a prominent subcarinal node. Probable epicardial node. Lungs/pleura: Multifocal pulmonary nodules in a pattern consistent with septic emboli/infection. These are bilateral and in falls all lobes of both lungs. Greatest burden is in the upper lobes were there is probable early cavitation. Nodule conglomerate in the upper lobe measures 3.4 x 1.9 cm. Within the medial right lung base his consolidation with central air, suspicious for necrotizing infection/ pneumonia. There is a partially loculated right pleural effusion measuring up to 2 cm, some peripheral enhancement. Trace left pleural effusion. Musculoskeletal: There are no acute or suspicious osseous abnormalities. Review of the MIP images confirms the above findings. CTA ABDOMEN AND PELVIS FINDINGS Aorta/vascular: Abdominal aorta is normal in caliber. There is no dissection, aneurysm, or acute aortic syndrome. Normal variant anatomy with small likely a accessory hepatic Ing gastric artery arising directly from the aorta. Celiac, superior mesenteric, and inferior mesenteric arteries are patent. Single bilateral renal arteries are patent. Normal caliber bilateral common and external iliac arteries. Liver: Normal for arterial phase imaging. Hepatobiliary: Gallbladder physiologically distended, no calcified stone. No biliary dilatation. Pancreas: No ductal dilatation or inflammation. Spleen: Normal for arterial phase imaging. Adrenal glands: No nodule. Kidneys: Symmetric renal enhancement. No hydronephrosis. No perinephric edema. Stomach/Bowel: Stomach physiologically distended. There are no dilated or thickened small bowel loops. Small volume of stool throughout the colon without colonic wall thickening. The appendix is normal. Vascular/Lymphatic: No retroperitoneal, mesenteric, or pelvic adenopathy.  Reproductive: No acute abnormality. Bladder: Physiologically distended without wall thickening. Other: No free air, free fluid, or intra-abdominal fluid collection. Musculoskeletal: There are no acute or suspicious osseous abnormalities. Review of the MIP images confirms the above findings. IMPRESSION: 1. Findings consistent with bilateral septic emboli involving all lobes of both lungs. There is evidence of central necrosis involving right upper and lower lobe lesions. Partially loculated right pleural effusion. Fluid adjacent of right heart border is likely loculated pleural fluid versus less likely pericardial fluid. Mediastinal and right hilar adenopathy is likely reactive. 2. No aortic dissection. 3. No acute abnormality in the abdomen/pelvis. These results were called by telephone at the time of interpretation on 08/31/2015 at 10:04 pm to Dr. Arby Barrette , who verbally acknowledged these results. Electronically Signed   By: Rubye Oaks M.D.   On: 08/31/2015 22:05     LOS: 4 days   Jeoffrey Massed, MD  [facility] Pager:[phone]  If 7PM-7AM, please contact night-coverage [email or web address] Password [email or web address] 09/05/2015, 3:25 PM

## 2015-09-05 NOTE — Progress Notes (Signed)
Patient ID: Kenneth Davis, male   DOB: 09-Jul-1990, 25 y.o.   MRN: [id number]          [facility]    Date of Admission:  08/31/2015           Day 6 vancomycin        Day 3 ampicillin sulbactam  Principal Problem:   Community acquired pneumonia Active Problems:   Cocaine abuse   Marijuana abuse   Tobacco abuse   IVDU (intravenous drug user)   Sepsis (HCC)   Chest pain   Elevated troponin   Alcohol abuse   GERD (gastroesophageal reflux disease)   Drug abuse   . ampicillin-sulbactam (UNASYN) IV  3 g Intravenous Q6H  . enoxaparin (LOVENOX) injection  40 mg Subcutaneous Q24H  . famotidine  20 mg Oral BID  . folic acid  1 mg Oral Daily  . multivitamin with minerals  1 tablet Oral Daily  . nicotine  21 mg Transdermal Daily  . thiamine  100 mg Oral Daily  . vancomycin  1,250 mg Intravenous Q8H    SUBJECTIVE: Kenneth Davis is feeling a little bit better today. Kenneth Davis is not coughing as much. Kenneth Davis is not having any more chest pain. Kenneth Davis is eager to go home.   Review of Systems: Review of Systems  Constitutional: Positive for malaise/fatigue. Negative for chills, diaphoresis and fever.  Respiratory: Positive for cough and sputum production. Negative for shortness of breath.   Cardiovascular: Negative for chest pain.  Gastrointestinal: Negative for abdominal pain, diarrhea, nausea and vomiting.  Genitourinary: Negative for dysuria.  Musculoskeletal: Negative for joint pain.  Psychiatric/Behavioral: Positive for substance abuse.    Past Medical History:  Diagnosis Date  . Alcohol abuse   . Cocaine abuse   . GERD (gastroesophageal reflux disease)   . IVDU (intravenous drug user)   . Marijuana abuse   . Tobacco abuse     Social History  Substance Use Topics  . Smoking status: Current Some Day Smoker  . Smokeless tobacco: Never Used  . Alcohol use Yes    History reviewed. No pertinent family history. No Known Allergies  OBJECTIVE: Vitals:   09/05/15  0357 09/05/15 0815 09/05/15 1230 09/05/15 1600  BP: 116/61 122/74 108/64 (!) 106/56  Pulse: 92 98 (!) 103 91  Resp: 20 (!) 24 20 20   Temp: 99.1 F (37.3 C) 99.5 F (37.5 C) 99 F (37.2 C) 99.5 F (37.5 C)  TempSrc: Oral Oral Oral Oral  SpO2: 95% 95% 95% 99%  Weight: 139 lb 9.6 oz (63.3 kg)     Height:       Body mass index is 20.03 kg/m.  Physical Exam  Constitutional: Kenneth Davis is oriented to person, place, and time.  Kenneth Davis is very pleasant. Kenneth Davis is in no distress.  HENT:  Very poor dentition.  Cardiovascular: Normal rate and regular rhythm.   No murmur heard. Pulmonary/Chest: Effort normal and breath sounds normal. Kenneth Davis has no wheezes. Kenneth Davis has no rales.  Some crackles in bases posteriorly.  Abdominal: Soft. There is no tenderness.  Musculoskeletal: Normal range of motion. Kenneth Davis exhibits no edema or tenderness.  Neurological: Kenneth Davis is alert and oriented to person, place, and time.  Skin: No rash noted.  Psychiatric: Mood and affect normal.    Lab Results Lab Results  Component Value Date   WBC 13.8 (H) 09/03/2015   HGB 14.3 09/03/2015   HCT 41.8 09/03/2015   MCV 91.5 09/03/2015   PLT 278 09/03/2015  Lab Results  Component Value Date   CREATININE 0.76 09/05/2015   BUN <5 (L) 09/05/2015   NA 134 (L) 09/05/2015   K 4.0 09/05/2015   CL 96 (L) 09/05/2015   CO2 27 09/05/2015    Lab Results  Component Value Date   ALT 20 09/02/2015   AST 23 09/02/2015   ALKPHOS 51 09/02/2015   BILITOT 1.3 (H) 09/02/2015     Microbiology: Recent Results (from the past 240 hour(s))  Culture, blood (routine x 2)     Status: None   Collection Time: 08/31/15  6:40 PM  Result Value Ref Range Status   Specimen Description BLOOD LEFT ANTECUBITAL  Final   Special Requests BOTTLES DRAWN AEROBIC AND ANAEROBIC 5CC  Final   Culture   Final    NO GROWTH 5 DAYS Performed at [facility]    Report Status 09/05/2015 FINAL  Final  Culture, blood (routine x 2)     Status: None   Collection Time:  08/31/15  7:00 PM  Result Value Ref Range Status   Specimen Description BLOOD RIGHT HAND  Final   Special Requests BOTTLES DRAWN AEROBIC ONLY 3CC  Final   Culture   Final    NO GROWTH 5 DAYS Performed at [facility]    Report Status 09/05/2015 FINAL  Final  Blood culture (routine x 2)     Status: None (Preliminary result)   Collection Time: 08/31/15 10:15 PM  Result Value Ref Range Status   Specimen Description BLOOD RIGHT ANTECUBITAL  Final   Special Requests BOTTLES DRAWN AEROBIC AND ANAEROBIC 5CC  Final   Culture   Final    NO GROWTH 4 DAYS Performed at [facility]    Report Status PENDING  Incomplete  Blood culture (routine x 2)     Status: None (Preliminary result)   Collection Time: 08/31/15 10:34 PM  Result Value Ref Range Status   Specimen Description BLOOD RIGHT WRIST  Final   Special Requests BOTTLES DRAWN AEROBIC AND ANAEROBIC 5CC  Final   Culture   Final    NO GROWTH 4 DAYS Performed at [facility]    Report Status PENDING  Incomplete  MRSA PCR Screening     Status: None   Collection Time: 09/01/15  2:01 AM  Result Value Ref Range Status   MRSA by PCR NEGATIVE NEGATIVE Final    Comment:        The GeneXpert MRSA Assay (FDA approved for NASAL specimens only), is one component of a comprehensive MRSA colonization surveillance program. It is not intended to diagnose MRSA infection nor to guide or monitor treatment for MRSA infections.   Culture, blood (routine x 2)     Status: None (Preliminary result)   Collection Time: 09/02/15 12:55 AM  Result Value Ref Range Status   Specimen Description BLOOD RIGHT HAND  Final   Special Requests IN PEDIATRIC BOTTLE 2CC  Final   Culture NO GROWTH 3 DAYS  Final   Report Status PENDING  Incomplete  Culture, blood (routine x 2)     Status: None (Preliminary result)   Collection Time: 09/02/15  1:08 AM  Result Value Ref Range Status   Specimen Description BLOOD LEFT ARM  Final   Special Requests  BOTTLES DRAWN AEROBIC AND ANAEROBIC 5CC  Final   Culture NO GROWTH 3 DAYS  Final   Report Status PENDING  Incomplete  Culture, blood (single)     Status: None (Preliminary result)  Collection Time: 09/02/15  8:23 PM  Result Value Ref Range Status   Specimen Description BLOOD LEFT HAND  Final   Special Requests IN PEDIATRIC BOTTLE 1CC  Final   Culture NO GROWTH 3 DAYS  Final   Report Status PENDING  Incomplete     ASSESSMENT: Kenneth Davis is now afebrile and improving clinically. All blood cultures are negative. I will plan on changing him to oral doxycycline and amoxicillin clavulanate tomorrow and treating for 10 more days.  PLAN: 1. Discharge home on oral doxycycline and amoxicillin clavulanate tomorrow  Cliffton Asters, MD [facility] [phone] pager   [phone] cell 09/05/2015, 4:24 PM

## 2015-09-06 DIAGNOSIS — A419 Sepsis, unspecified organism: Principal | ICD-10-CM

## 2015-09-06 DIAGNOSIS — R079 Chest pain, unspecified: Secondary | ICD-10-CM

## 2015-09-06 DIAGNOSIS — K219 Gastro-esophageal reflux disease without esophagitis: Secondary | ICD-10-CM

## 2015-09-06 DIAGNOSIS — F17210 Nicotine dependence, cigarettes, uncomplicated: Secondary | ICD-10-CM

## 2015-09-06 LAB — CULTURE, BLOOD (ROUTINE X 2)
CULTURE: NO GROWTH
Culture: NO GROWTH

## 2015-09-06 MED ORDER — AMOXICILLIN-POT CLAVULANATE 875-125 MG PO TABS: 1.0000 | ORAL_TABLET | Freq: Two times a day (BID) | ORAL | 0 refills | Status: DC

## 2015-09-06 MED ORDER — DOXYCYCLINE HYCLATE 100 MG PO CAPS: 100.0000 mg | ORAL_CAPSULE | Freq: Two times a day (BID) | ORAL | 0 refills | Status: DC

## 2015-09-06 MED FILL — DOXYCYCLINE 100 MG TABLET: 100 | 10 days supply | Qty: 20 | Fill #0

## 2015-09-06 MED FILL — AMOX-CLAV 875-125 MG TABLET: 875-125 | 10 days supply | Qty: 20 | Fill #0

## 2015-09-06 NOTE — Care Management Note (Signed)
Case Management Note  Patient Details  Name: Aquilla Hackerustin Sissel MRN: [id number] Date of Birth: September 03, 1990  Subjective/Objective:   Pt presented for Sepsis secondary to septic emboli to the lungs. Pt is without Insurance and PCP.               Action/Plan: CM was able to speak with [facility] Liaison Peterson LombardJessica Beck and she was able to get [facility] @ 3 pm for Hospital Follow up. CM [facility] to make sure antibiotics would be available.   Expected Discharge Date:  09/04/15               Expected Discharge Plan:  Home/Self Care  In-House Referral:  NA  Discharge planning Services  CM Consult, Follow-up appt scheduled, Indigent [facility], Medication Assistance  Post Acute Care Choice:  NA Choice offered to:  NA  DME Arranged:  N/A DME Agency:  NA  HH Arranged:  NA HH Agency:  NA  Status of Service:  Completed, signed off  If discussed at Long Length of Stay Meetings, dates discussed:    Additional Comments: Pt aware of disposition plans.   Gala LewandowskyGraves-Bigelow, Nazariah Cadet Kaye, RN 09/06/2015, 10:34 AM

## 2015-09-06 NOTE — Progress Notes (Signed)
Pt discharged home with friend.  Pt educated importance of stopping IV drug usage and I gave pt a hand out of hotlines and abuse numbers to call.  Pt stated he understood the importance of stopping.  Discharge instructions and education explained to pt, reviewed medication, all questions answered.  Assessment unchanged from earlier.

## 2015-09-06 NOTE — Progress Notes (Signed)
Patient ID: Kenneth Davis, male   DOB: 04-02-1990, 25 y.o.   MRN: [id number]          [facility]    Date of Admission:  08/31/2015           Day 7 vancomycin        Day 4 ampicillin sulbactam  Principal Problem:   Community acquired pneumonia Active Problems:   Cocaine abuse   Marijuana abuse   Tobacco abuse   IVDU (intravenous drug user)   Sepsis (HCC)   Chest pain   Elevated troponin   Alcohol abuse   GERD (gastroesophageal reflux disease)   Drug abuse   . ampicillin-sulbactam (UNASYN) IV  3 g Intravenous Q6H  . enoxaparin (LOVENOX) injection  40 mg Subcutaneous Q24H  . famotidine  20 mg Oral BID  . folic acid  1 mg Oral Daily  . multivitamin with minerals  1 tablet Oral Daily  . nicotine  21 mg Transdermal Daily  . thiamine  100 mg Oral Daily  . vancomycin  1,250 mg Intravenous Q8H    SUBJECTIVE: He feels better each day.  Review of Systems: Review of Systems  Constitutional: Positive for malaise/fatigue. Negative for chills, diaphoresis and fever.  Respiratory: Positive for cough and sputum production. Negative for shortness of breath.   Cardiovascular: Negative for chest pain.  Gastrointestinal: Negative for abdominal pain, diarrhea, nausea and vomiting.  Genitourinary: Negative for dysuria.  Musculoskeletal: Negative for joint pain.  Psychiatric/Behavioral: Positive for substance abuse.    Past Medical History:  Diagnosis Date  . Alcohol abuse   . Cocaine abuse   . GERD (gastroesophageal reflux disease)   . IVDU (intravenous drug user)   . Marijuana abuse   . Tobacco abuse     Social History  Substance Use Topics  . Smoking status: Current Some Day Smoker  . Smokeless tobacco: Never Used  . Alcohol use Yes    History reviewed. No pertinent family history. No Known Allergies  OBJECTIVE: Vitals:   09/05/15 2000 09/06/15 0037 09/06/15 0617 09/06/15 0835  BP: (!) 95/54 107/62 108/60 (!) 97/46  Pulse:  75 70 83    Resp: (!) 23 17 16  (!) 25  Temp:  99.5 F (37.5 C) 98.6 F (37 C) 100 F (37.8 C)  TempSrc:  Oral Oral Oral  SpO2:  98% 98% 97%  Weight:      Height:       Body mass index is 20.03 kg/m.  Physical Exam  Constitutional: He is oriented to person, place, and time.  He is resting quietly in bed.  HENT:  Very poor dentition.  Cardiovascular: Normal rate and regular rhythm.   No murmur heard. Pulmonary/Chest: Effort normal and breath sounds normal. He has no wheezes. He has no rales.  Some crackles in bases posteriorly.  Abdominal: Soft. There is no tenderness.  Musculoskeletal: Normal range of motion. He exhibits no edema or tenderness.  Neurological: He is alert and oriented to person, place, and time.  Skin: No rash noted.  Psychiatric: Mood and affect normal.    Lab Results Lab Results  Component Value Date   WBC 13.8 (H) 09/03/2015   HGB 14.3 09/03/2015   HCT 41.8 09/03/2015   MCV 91.5 09/03/2015   PLT 278 09/03/2015    Lab Results  Component Value Date   CREATININE 0.76 09/05/2015   BUN <5 (L) 09/05/2015   NA 134 (L) 09/05/2015   K 4.0 09/05/2015   CL  96 (L) 09/05/2015   CO2 27 09/05/2015    Lab Results  Component Value Date   ALT 20 09/02/2015   AST 23 09/02/2015   ALKPHOS 51 09/02/2015   BILITOT 1.3 (H) 09/02/2015     Microbiology: Recent Results (from the past 240 hour(s))  Culture, blood (routine x 2)     Status: None   Collection Time: 08/31/15  6:40 PM  Result Value Ref Range Status   Specimen Description BLOOD LEFT ANTECUBITAL  Final   Special Requests BOTTLES DRAWN AEROBIC AND ANAEROBIC 5CC  Final   Culture   Final    NO GROWTH 5 DAYS [facility]    Report Status 09/05/2015 FINAL  Final  Culture, blood (routine x 2)     Status: None   Collection Time: 08/31/15  7:00 PM  Result Value Ref Range Status   Specimen Description BLOOD RIGHT HAND  Final   Special Requests BOTTLES DRAWN AEROBIC ONLY 3CC  Final   Culture    Final    NO GROWTH 5 DAYS [facility]    Report Status 09/05/2015 FINAL  Final  Blood culture (routine x 2)     Status: None (Preliminary result)   Collection Time: 08/31/15 10:15 PM  Result Value Ref Range Status   Specimen Description BLOOD RIGHT ANTECUBITAL  Final   Special Requests BOTTLES DRAWN AEROBIC AND ANAEROBIC 5CC  Final   Culture   Final    NO GROWTH 4 DAYS [facility]    Report Status PENDING  Incomplete  Blood culture (routine x 2)     Status: None (Preliminary result)   Collection Time: 08/31/15 10:34 PM  Result Value Ref Range Status   Specimen Description BLOOD RIGHT WRIST  Final   Special Requests BOTTLES DRAWN AEROBIC AND ANAEROBIC 5CC  Final   Culture   Final    NO GROWTH 4 DAYS [facility]formed at [facility]    Report Status PENDING  Incomplete  MRSA PCR Screening     Status: None   Collection Time: 09/01/15  2:01 AM  Result Value Ref Range Status   MRSA by PCR NEGATIVE NEGATIVE Final    Comment:        The GeneXpert MRSA Assay (FDA approved for NASAL specimens only), is one component of a comprehensive MRSA colonization surveillance program. It is not intended to diagnose MRSA infection nor to guide or monitor treatment for MRSA infections.   Culture, blood (routine x 2)     Status: None (Preliminary result)   Collection Time: 09/02/15 12:55 AM  Result Value Ref Range Status   Specimen Description BLOOD RIGHT HAND  Final   Special Requests IN PEDIATRIC BOTTLE 2CC  Final   Culture NO GROWTH 3 DAYS  Final   Report Status PENDING  Incomplete  Culture, blood (routine x 2)     Status: None (Preliminary result)   Collection Time: 09/02/15  1:08 AM  Result Value Ref Range Status   Specimen Description BLOOD LEFT ARM  Final   Special Requests BOTTLES DRAWN AEROBIC AND ANAEROBIC 5CC  Final   Culture NO GROWTH 3 DAYS  Final   Report Status PENDING  Incomplete  Culture, blood (single)     Status: None  (Preliminary result)   Collection Time: 09/02/15  8:23 PM  Result Value Ref Range Status   Specimen Description BLOOD LEFT HAND  Final   Special Requests IN PEDIATRIC BOTTLE 1CC  Final  Culture NO GROWTH 3 DAYS  Final   Report Status PENDING  Incomplete     ASSESSMENT: He continues to improve.  PLAN: 1. Discharge home on oral doxycycline and amoxicillin clavulanate for 10 more days 2. I will sign off now but please call if I can be of further assistance while he is here  Cliffton Asters, MD [facility] for Infectious Disease [facility] [phone] pager   [phone] cell 09/06/2015, 8:58 AM

## 2015-09-06 NOTE — Discharge Summary (Signed)
PATIENT DETAILS Name: Kenneth Davis Age: 25 y.o. Sex: male Date of Birth: [birth year] MRN: [id number]. Admitting Physician: Lorretta Harp, MD PCP:No PCP Per Patient  Admit Date: 08/31/2015 Discharge date: 09/06/2015  Recommendations for Outpatient Follow-up:  1. Follow up with PCP in 1-2 weeks 2. Please obtain BMP/CBC at next visit with PCP 3. Please follow up on the following pending results: Blood cultures done on 8/25, 8/27  Admitted From:  Home  Disposition: Home   Home Health: /No  Equipment/Devices: None  Discharge Condition: Stable  CODE STATUS: FULL CODE  Diet recommendation:   Regular  Brief Summary: See H&P, Labs, Consult and Test reports for all details in brief,Patient is a 25 y.o. male with history of IV drug use (cocaine), marijuana use presented with almost 1 week history of fever with chills, generalized body aches and headaches and cough. Found to be febrile on initial presentation to the ED, underwent CT scan of the head, chest and abdomen. CT chest was consistent with bilateral septic emboli. CT head did not show any acute abnormalities. Patient was presumed to have infective endocarditis, and admitted to the hospitalist service. Multiple Blood cultures are currently negative so far.ID was consulted, patient was on empiric vancomycin and Unasyn, recommendations from infectious disease are to transition to doxycycline and Augmentin on discharge. Please see below for further details.  Brief Hospital Course: Sepsis secondary to septic emboli to the lungs: Sepsis pathophysiology has resolved,no  longer febrile, cough has remarkably improved as well. Unfortunately multiple blood cultures are negative so far. Transthoracic echocardiogram negative for vegetations. Infectious disease does not recommend a TEE. Since patient has defervesced, and is markedly improved, infectious disease recommends that we transition to doxycycline and Augmentin for another 10 more days.  No further recommendations from infectious disease, okay to discharge today.  Active Problems: Chest pain: Atypical as it is mostly bilateral, likley secondary to multiple septic emboli and repeated coughing- did have minimally elevated troponin-trend is flat and not consistent with ACS. Echocardiogram EF 60-65%, with no regional wall motion abnormalities. Suspect no further workup is required  GERD: Continue Pepcid as needed as outpatient  Polysubstance abuse: Acknowledges cocaine (claims last use was 3-4 weeks back prior to current admit), marijuana and occasional alcohol use.  Social work consultation completed, patient refused outpatient resources. I have counseled him extensively, he complains that no longer will use drugs in the future.   Procedures/Studies: Echo 8/28 >>EF 60-65 patient, no wall motion abnormalities.  Discharge Diagnoses:  Principal Problem:   Septic emboli to lungs Active Problems:   Cocaine abuse   Marijuana abuse   Tobacco abuse   IVDU (intravenous drug user)   Sepsis (HCC)   Chest pain   Elevated troponin   Alcohol abuse   GERD (gastroesophageal reflux disease)   Drug abuse   Discharge Instructions:  Activity:  As tolerated with Full fall precautions use walker/cane & assistance as needed  Discharge Instructions    Call MD for:  difficulty breathing, headache or visual disturbances    Complete by:  As directed   Call MD for:  severe uncontrolled pain    Complete by:  As directed   Diet general    Complete by:  As directed   Discharge instructions    Complete by:  As directed   Follow with Primary MD  No PCP Per Patient  and other consultant's as instructed your Hospitalist MD  Please get a complete blood count and chemistry panel checked by your Primary  MD at your next visit, and again as instructed by your Primary MD.  Get Medicines reviewed and adjusted: Please take all your medications with you for your next visit with your Primary  MD  Laboratory/radiological data: Please request your Primary MD to go over all hospital tests and procedure/radiological results at the follow up, please ask your Primary MD to get all Hospital records sent to his/her office.  In some cases, they will be blood work, cultures and biopsy results pending at the time of your discharge. Please request that your primary care M.D. follows up on these results.  Also Note the following: If you experience worsening of your admission symptoms, develop shortness of breath, life threatening emergency, suicidal or homicidal thoughts you must seek medical attention immediately by calling 911 or calling your MD immediately  if symptoms less severe.  You must read complete instructions/literature along with all the possible adverse reactions/side effects for all the Medicines you take and that have been prescribed to you. Take any new Medicines after you have completely understood and accpet all the possible adverse reactions/side effects.   Do not drive when taking Pain medications or sleeping medications (Benzodaizepines)  Do not take more than prescribed Pain, Sleep and Anxiety Medications. It is not advisable to combine anxiety,sleep and pain medications without talking with your primary care practitioner  Special Instructions: If you have smoked or chewed Tobacco  in the last 2 yrs please stop smoking, stop any regular Alcohol  and or any Recreational drug use.  Wear Seat belts while driving.  Please note: You were cared for by a hospitalist during your hospital stay. Once you are discharged, your primary care physician will handle any further medical issues. Please note that NO REFILLS for any discharge medications will be authorized once you are discharged, as it is imperative that you return to your primary care physician (or establish a relationship with a primary care physician if you do not have one) for your post hospital discharge needs so that  they can reassess your need for medications and monitor your lab values.   Increase activity slowly    Complete by:  As directed       Medication List    TAKE these medications   amoxicillin-clavulanate 875-125 MG tablet Commonly known as:  AUGMENTIN Take 1 tablet by mouth 2 (two) times daily.   doxycycline 100 MG capsule Commonly known as:  VIBRAMYCIN Take 1 capsule (100 mg total) by mouth 2 (two) times daily.       No Known Allergies    Consultations:   ID   Other Procedures/Studies: Dg Chest 2 View  Result Date: 09/04/2015 CLINICAL DATA:  Cough and shortness of breath for 4 days. Recent diagnosis of pneumonia. EXAM: CHEST  2 VIEW COMPARISON:  CT chest and PA and lateral chest 08/31/2015. FINDINGS: There has been interval worsening of right lower lobe airspace disease. New patchy airspace disease is seen in the left lung base. Small bilateral pleural effusions, greater on the left, appear increased in size. Patchy airspace opacity in the right upper lobe is unchanged. IMPRESSION: Increased right greater than left basilar airspace disease. Right upper lobe opacity is not notably changed. Increased small bilateral pleural effusions, greater on the right. Electronically Signed   By: Drusilla Kanner M.D.   On: 09/04/2015 13:06   Dg Chest 2 View  Result Date: 08/31/2015 CLINICAL DATA:  Right-sided chest pain. EXAM: CHEST  2 VIEW COMPARISON:  None. FINDINGS: Mild right upper  lobe airspace opacity. No other evidence of focal consolidation. No pulmonary edema. Normal cardiomediastinal contours. No pleural effusion or pneumothorax. IMPRESSION: Mild right upper lobe airspace opacity could indicate developing infection. Otherwise normal chest radiograph. Electronically Signed   By: Deatra Robinson M.D.   On: 08/31/2015 19:16   Ct Head Wo Contrast  Result Date: 08/31/2015 CLINICAL DATA:  Patient was drinking and doing drugs last night and now complains of chest pain radiating to the mid  abdomen. Headache. EXAM: CT HEAD WITHOUT CONTRAST TECHNIQUE: Contiguous axial images were obtained from the base of the skull through the vertex without intravenous contrast. COMPARISON:  06/21/2008 FINDINGS: Brain: Examination technically limited due to motion artifact. Ventricles and sulci appear symmetrical. No ventricular dilatation. No mass effect or midline shift. No abnormal extra-axial fluid collections. Gray-white matter junctions are distinct. Basal cisterns are not effaced. No evidence of acute intracranial hemorrhage. Vascular: No hyperdense vessel or unexpected calcification. Skull: No depressed skull fractures. Sinuses/Orbits: No acute finding. Other: No significant changes since previous study. IMPRESSION: No acute intracranial abnormalities. Electronically Signed   By: Burman Nieves M.D.   On: 08/31/2015 21:22   Ct Angio Chest Aorta W/cm &/or Wo/cm  Result Date: 08/31/2015 CLINICAL DATA:  Chest pain radiating to the abdomen. History of IV drug use. EXAM: CT ANGIOGRAPHY CHEST, ABDOMEN AND PELVIS TECHNIQUE: Multidetector CT imaging through the chest, abdomen and pelvis was performed using the standard protocol during bolus administration of intravenous contrast. Multiplanar reconstructed images and MIPs were obtained and reviewed to evaluate the vascular anatomy. CONTRAST:  100 cc Isovue 370 IV COMPARISON:  Chest radiograph earlier this day. FINDINGS: CTA CHEST FINDINGS Cardiovascular: No aortic dissection, aneurysm, hematoma, or acute aortic syndrome. Thoracic aorta is normal in caliber. Heart is normal in size. No filling defects in the central pulmonary arteries. Mediastinum/nodes: Fluid abutting the right mediastinal border measuring up to 3.4 cm may be a loculated pleural effusion versus less likely pericardial effusion. This is heterogeneous fluid density with minimal peripheral but no internal enhancement. Enlarged right hilar node measures 14 mm. Small prevascular nodes. There is a  prominent subcarinal node. Probable epicardial node. Lungs/pleura: Multifocal pulmonary nodules in a pattern consistent with septic emboli/infection. These are bilateral and in falls all lobes of both lungs. Greatest burden is in the upper lobes were there is probable early cavitation. Nodule conglomerate in the upper lobe measures 3.4 x 1.9 cm. Within the medial right lung base his consolidation with central air, suspicious for necrotizing infection/ pneumonia. There is a partially loculated right pleural effusion measuring up to 2 cm, some peripheral enhancement. Trace left pleural effusion. Musculoskeletal: There are no acute or suspicious osseous abnormalities. Review of the MIP images confirms the above findings. CTA ABDOMEN AND PELVIS FINDINGS Aorta/vascular: Abdominal aorta is normal in caliber. There is no dissection, aneurysm, or acute aortic syndrome. Normal variant anatomy with small likely a accessory hepatic Ing gastric artery arising directly from the aorta. Celiac, superior mesenteric, and inferior mesenteric arteries are patent. Single bilateral renal arteries are patent. Normal caliber bilateral common and external iliac arteries. Liver: Normal for arterial phase imaging. Hepatobiliary: Gallbladder physiologically distended, no calcified stone. No biliary dilatation. Pancreas: No ductal dilatation or inflammation. Spleen: Normal for arterial phase imaging. Adrenal glands: No nodule. Kidneys: Symmetric renal enhancement. No hydronephrosis. No perinephric edema. Stomach/Bowel: Stomach physiologically distended. There are no dilated or thickened small bowel loops. Small volume of stool throughout the colon without colonic wall thickening. The appendix is normal. Vascular/Lymphatic: No retroperitoneal,  mesenteric, or pelvic adenopathy. Reproductive: No acute abnormality. Bladder: Physiologically distended without wall thickening. Other: No free air, free fluid, or intra-abdominal fluid collection.  Musculoskeletal: There are no acute or suspicious osseous abnormalities. Review of the MIP images confirms the above findings. IMPRESSION: 1. Findings consistent with bilateral septic emboli involving all lobes of both lungs. There is evidence of central necrosis involving right upper and lower lobe lesions. Partially loculated right pleural effusion. Fluid adjacent of right heart border is likely loculated pleural fluid versus less likely pericardial fluid. Mediastinal and right hilar adenopathy is likely reactive. 2. No aortic dissection. 3. No acute abnormality in the abdomen/pelvis. These results were called by telephone at the time of interpretation on 08/31/2015 at 10:04 pm to Dr. Arby BarretteMARCY PFEIFFER , who verbally acknowledged these results. Electronically Signed   By: Rubye OaksMelanie  Ehinger M.D.   On: 08/31/2015 22:05   Ct Angio Abd/pel W/ And/or W/o  Result Date: 08/31/2015 CLINICAL DATA:  Chest pain radiating to the abdomen. History of IV drug use. EXAM: CT ANGIOGRAPHY CHEST, ABDOMEN AND PELVIS TECHNIQUE: Multidetector CT imaging through the chest, abdomen and pelvis was performed using the standard protocol during bolus administration of intravenous contrast. Multiplanar reconstructed images and MIPs were obtained and reviewed to evaluate the vascular anatomy. CONTRAST:  100 cc Isovue 370 IV COMPARISON:  Chest radiograph earlier this day. FINDINGS: CTA CHEST FINDINGS Cardiovascular: No aortic dissection, aneurysm, hematoma, or acute aortic syndrome. Thoracic aorta is normal in caliber. Heart is normal in size. No filling defects in the central pulmonary arteries. Mediastinum/nodes: Fluid abutting the right mediastinal border measuring up to 3.4 cm may be a loculated pleural effusion versus less likely pericardial effusion. This is heterogeneous fluid density with minimal peripheral but no internal enhancement. Enlarged right hilar node measures 14 mm. Small prevascular nodes. There is a prominent subcarinal node.  Probable epicardial node. Lungs/pleura: Multifocal pulmonary nodules in a pattern consistent with septic emboli/infection. These are bilateral and in falls all lobes of both lungs. Greatest burden is in the upper lobes were there is probable early cavitation. Nodule conglomerate in the upper lobe measures 3.4 x 1.9 cm. Within the medial right lung base his consolidation with central air, suspicious for necrotizing infection/ pneumonia. There is a partially loculated right pleural effusion measuring up to 2 cm, some peripheral enhancement. Trace left pleural effusion. Musculoskeletal: There are no acute or suspicious osseous abnormalities. Review of the MIP images confirms the above findings. CTA ABDOMEN AND PELVIS FINDINGS Aorta/vascular: Abdominal aorta is normal in caliber. There is no dissection, aneurysm, or acute aortic syndrome. Normal variant anatomy with small likely a accessory hepatic Ing gastric artery arising directly from the aorta. Celiac, superior mesenteric, and inferior mesenteric arteries are patent. Single bilateral renal arteries are patent. Normal caliber bilateral common and external iliac arteries. Liver: Normal for arterial phase imaging. Hepatobiliary: Gallbladder physiologically distended, no calcified stone. No biliary dilatation. Pancreas: No ductal dilatation or inflammation. Spleen: Normal for arterial phase imaging. Adrenal glands: No nodule. Kidneys: Symmetric renal enhancement. No hydronephrosis. No perinephric edema. Stomach/Bowel: Stomach physiologically distended. There are no dilated or thickened small bowel loops. Small volume of stool throughout the colon without colonic wall thickening. The appendix is normal. Vascular/Lymphatic: No retroperitoneal, mesenteric, or pelvic adenopathy. Reproductive: No acute abnormality. Bladder: Physiologically distended without wall thickening. Other: No free air, free fluid, or intra-abdominal fluid collection. Musculoskeletal: There are no  acute or suspicious osseous abnormalities. Review of the MIP images confirms the above findings. IMPRESSION: 1. Findings  consistent with bilateral septic emboli involving all lobes of both lungs. There is evidence of central necrosis involving right upper and lower lobe lesions. Partially loculated right pleural effusion. Fluid adjacent of right heart border is likely loculated pleural fluid versus less likely pericardial fluid. Mediastinal and right hilar adenopathy is likely reactive. 2. No aortic dissection. 3. No acute abnormality in the abdomen/pelvis. These results were called by telephone at the time of interpretation on 08/31/2015 at 10:04 pm to Dr. Arby Barrette , who verbally acknowledged these results. Electronically Signed   By: Rubye Oaks M.D.   On: 08/31/2015 22:05      TODAY-DAY OF DISCHARGE:  Subjective:   Jehan Bonano today has no headache,no chest abdominal pain,no new weakness tingling or numbness, feels much better wants to go home today.   Objective:   Blood pressure (!) 97/46, pulse 83, temperature 100 F (37.8 C), temperature source Oral, resp. rate (!) 25, height 5\' 10"  (1.778 m), weight 63.3 kg (139 lb 9.6 oz), SpO2 97 %.  Intake/Output Summary (Last 24 hours) at 09/06/15 1016 Last data filed at 09/06/15 0830  Gross per 24 hour  Intake             2350 ml  Output             2550 ml  Net             -200 ml   Filed Weights   09/04/15 0411 09/04/15 0608 09/05/15 0357  Weight: 64.3 kg (141 lb 11.2 oz) 64.2 kg (141 lb 9.6 oz) 63.3 kg (139 lb 9.6 oz)    Exam: Awake Alert, Oriented *3, No new F.N deficits, Normal affect .AT,PERRAL Supple Neck,No JVD, No cervical lymphadenopathy appriciated.  Symmetrical Chest wall movement, Good air movement bilaterally, CTAB RRR,No Gallops,Rubs or new Murmurs, No Parasternal Heave +ve B.Sounds, Abd Soft, Non tender, No organomegaly appriciated, No rebound -guarding or rigidity. No Cyanosis, Clubbing or edema, No new  Rash or bruise   PERTINENT RADIOLOGIC STUDIES: Dg Chest 2 View  Result Date: 09/04/2015 CLINICAL DATA:  Cough and shortness of breath for 4 days. Recent diagnosis of pneumonia. EXAM: CHEST  2 VIEW COMPARISON:  CT chest and PA and lateral chest 08/31/2015. FINDINGS: There has been interval worsening of right lower lobe airspace disease. New patchy airspace disease is seen in the left lung base. Small bilateral pleural effusions, greater on the left, appear increased in size. Patchy airspace opacity in the right upper lobe is unchanged. IMPRESSION: Increased right greater than left basilar airspace disease. Right upper lobe opacity is not notably changed. Increased small bilateral pleural effusions, greater on the right. Electronically Signed   By: Drusilla Kanner M.D.   On: 09/04/2015 13:06   Dg Chest 2 View  Result Date: 08/31/2015 CLINICAL DATA:  Right-sided chest pain. EXAM: CHEST  2 VIEW COMPARISON:  None. FINDINGS: Mild right upper lobe airspace opacity. No other evidence of focal consolidation. No pulmonary edema. Normal cardiomediastinal contours. No pleural effusion or pneumothorax. IMPRESSION: Mild right upper lobe airspace opacity could indicate developing infection. Otherwise normal chest radiograph. Electronically Signed   By: Deatra Robinson M.D.   On: 08/31/2015 19:16   Ct Head Wo Contrast  Result Date: 08/31/2015 CLINICAL DATA:  Patient was drinking and doing drugs last night and now complains of chest pain radiating to the mid abdomen. Headache. EXAM: CT HEAD WITHOUT CONTRAST TECHNIQUE: Contiguous axial images were obtained from the base of the skull through the vertex without  intravenous contrast. COMPARISON:  06/21/2008 FINDINGS: Brain: Examination technically limited due to motion artifact. Ventricles and sulci appear symmetrical. No ventricular dilatation. No mass effect or midline shift. No abnormal extra-axial fluid collections. Gray-white matter junctions are distinct. Basal  cisterns are not effaced. No evidence of acute intracranial hemorrhage. Vascular: No hyperdense vessel or unexpected calcification. Skull: No depressed skull fractures. Sinuses/Orbits: No acute finding. Other: No significant changes since previous study. IMPRESSION: No acute intracranial abnormalities. Electronically Signed   By: Burman Nieves M.D.   On: 08/31/2015 21:22   Ct Angio Chest Aorta W/cm &/or Wo/cm  Result Date: 08/31/2015 CLINICAL DATA:  Chest pain radiating to the abdomen. History of IV drug use. EXAM: CT ANGIOGRAPHY CHEST, ABDOMEN AND PELVIS TECHNIQUE: Multidetector CT imaging through the chest, abdomen and pelvis was performed using the standard protocol during bolus administration of intravenous contrast. Multiplanar reconstructed images and MIPs were obtained and reviewed to evaluate the vascular anatomy. CONTRAST:  100 cc Isovue 370 IV COMPARISON:  Chest radiograph earlier this day. FINDINGS: CTA CHEST FINDINGS Cardiovascular: No aortic dissection, aneurysm, hematoma, or acute aortic syndrome. Thoracic aorta is normal in caliber. Heart is normal in size. No filling defects in the central pulmonary arteries. Mediastinum/nodes: Fluid abutting the right mediastinal border measuring up to 3.4 cm may be a loculated pleural effusion versus less likely pericardial effusion. This is heterogeneous fluid density with minimal peripheral but no internal enhancement. Enlarged right hilar node measures 14 mm. Small prevascular nodes. There is a prominent subcarinal node. Probable epicardial node. Lungs/pleura: Multifocal pulmonary nodules in a pattern consistent with septic emboli/infection. These are bilateral and in falls all lobes of both lungs. Greatest burden is in the upper lobes were there is probable early cavitation. Nodule conglomerate in the upper lobe measures 3.4 x 1.9 cm. Within the medial right lung base his consolidation with central air, suspicious for necrotizing infection/ pneumonia.  There is a partially loculated right pleural effusion measuring up to 2 cm, some peripheral enhancement. Trace left pleural effusion. Musculoskeletal: There are no acute or suspicious osseous abnormalities. Review of the MIP images confirms the above findings. CTA ABDOMEN AND PELVIS FINDINGS Aorta/vascular: Abdominal aorta is normal in caliber. There is no dissection, aneurysm, or acute aortic syndrome. Normal variant anatomy with small likely a accessory hepatic Ing gastric artery arising directly from the aorta. Celiac, superior mesenteric, and inferior mesenteric arteries are patent. Single bilateral renal arteries are patent. Normal caliber bilateral common and external iliac arteries. Liver: Normal for arterial phase imaging. Hepatobiliary: Gallbladder physiologically distended, no calcified stone. No biliary dilatation. Pancreas: No ductal dilatation or inflammation. Spleen: Normal for arterial phase imaging. Adrenal glands: No nodule. Kidneys: Symmetric renal enhancement. No hydronephrosis. No perinephric edema. Stomach/Bowel: Stomach physiologically distended. There are no dilated or thickened small bowel loops. Small volume of stool throughout the colon without colonic wall thickening. The appendix is normal. Vascular/Lymphatic: No retroperitoneal, mesenteric, or pelvic adenopathy. Reproductive: No acute abnormality. Bladder: Physiologically distended without wall thickening. Other: No free air, free fluid, or intra-abdominal fluid collection. Musculoskeletal: There are no acute or suspicious osseous abnormalities. Review of the MIP images confirms the above findings. IMPRESSION: 1. Findings consistent with bilateral septic emboli involving all lobes of both lungs. There is evidence of central necrosis involving right upper and lower lobe lesions. Partially loculated right pleural effusion. Fluid adjacent of right heart border is likely loculated pleural fluid versus less likely pericardial fluid.  Mediastinal and right hilar adenopathy is likely reactive. 2. No aortic dissection.  3. No acute abnormality in the abdomen/pelvis. These results were called by telephone at the time of interpretation on 08/31/2015 at 10:04 pm to Dr. Arby Barrette , who verbally acknowledged these results. Electronically Signed   By: Rubye Oaks M.D.   On: 08/31/2015 22:05   Ct Angio Abd/pel W/ And/or W/o  Result Date: 08/31/2015 CLINICAL DATA:  Chest pain radiating to the abdomen. History of IV drug use. EXAM: CT ANGIOGRAPHY CHEST, ABDOMEN AND PELVIS TECHNIQUE: Multidetector CT imaging through the chest, abdomen and pelvis was performed using the standard protocol during bolus administration of intravenous contrast. Multiplanar reconstructed images and MIPs were obtained and reviewed to evaluate the vascular anatomy. CONTRAST:  100 cc Isovue 370 IV COMPARISON:  Chest radiograph earlier this day. FINDINGS: CTA CHEST FINDINGS Cardiovascular: No aortic dissection, aneurysm, hematoma, or acute aortic syndrome. Thoracic aorta is normal in caliber. Heart is normal in size. No filling defects in the central pulmonary arteries. Mediastinum/nodes: Fluid abutting the right mediastinal border measuring up to 3.4 cm may be a loculated pleural effusion versus less likely pericardial effusion. This is heterogeneous fluid density with minimal peripheral but no internal enhancement. Enlarged right hilar node measures 14 mm. Small prevascular nodes. There is a prominent subcarinal node. Probable epicardial node. Lungs/pleura: Multifocal pulmonary nodules in a pattern consistent with septic emboli/infection. These are bilateral and in falls all lobes of both lungs. Greatest burden is in the upper lobes were there is probable early cavitation. Nodule conglomerate in the upper lobe measures 3.4 x 1.9 cm. Within the medial right lung base his consolidation with central air, suspicious for necrotizing infection/ pneumonia. There is a partially  loculated right pleural effusion measuring up to 2 cm, some peripheral enhancement. Trace left pleural effusion. Musculoskeletal: There are no acute or suspicious osseous abnormalities. Review of the MIP images confirms the above findings. CTA ABDOMEN AND PELVIS FINDINGS Aorta/vascular: Abdominal aorta is normal in caliber. There is no dissection, aneurysm, or acute aortic syndrome. Normal variant anatomy with small likely a accessory hepatic Ing gastric artery arising directly from the aorta. Celiac, superior mesenteric, and inferior mesenteric arteries are patent. Single bilateral renal arteries are patent. Normal caliber bilateral common and external iliac arteries. Liver: Normal for arterial phase imaging. Hepatobiliary: Gallbladder physiologically distended, no calcified stone. No biliary dilatation. Pancreas: No ductal dilatation or inflammation. Spleen: Normal for arterial phase imaging. Adrenal glands: No nodule. Kidneys: Symmetric renal enhancement. No hydronephrosis. No perinephric edema. Stomach/Bowel: Stomach physiologically distended. There are no dilated or thickened small bowel loops. Small volume of stool throughout the colon without colonic wall thickening. The appendix is normal. Vascular/Lymphatic: No retroperitoneal, mesenteric, or pelvic adenopathy. Reproductive: No acute abnormality. Bladder: Physiologically distended without wall thickening. Other: No free air, free fluid, or intra-abdominal fluid collection. Musculoskeletal: There are no acute or suspicious osseous abnormalities. Review of the MIP images confirms the above findings. IMPRESSION: 1. Findings consistent with bilateral septic emboli involving all lobes of both lungs. There is evidence of central necrosis involving right upper and lower lobe lesions. Partially loculated right pleural effusion. Fluid adjacent of right heart border is likely loculated pleural fluid versus less likely pericardial fluid. Mediastinal and right hilar  adenopathy is likely reactive. 2. No aortic dissection. 3. No acute abnormality in the abdomen/pelvis. These results were called by telephone at the time of interpretation on 08/31/2015 at 10:04 pm to Dr. Arby Barrette , who verbally acknowledged these results. Electronically Signed   By: Rubye Oaks M.D.   On: 08/31/2015  22:05     PERTINENT LAB RESULTS: CBC: No results for input(s): WBC, HGB, HCT, PLT in the last 72 hours. CMET CMP     Component Value Date/Time   NA 134 (L) 09/05/2015 0336   K 4.0 09/05/2015 0336   CL 96 (L) 09/05/2015 0336   CO2 27 09/05/2015 0336   GLUCOSE 90 09/05/2015 0336   BUN <5 (L) 09/05/2015 0336   CREATININE 0.76 09/05/2015 0336   CALCIUM 8.5 (L) 09/05/2015 0336   PROT 6.3 (L) 09/02/2015 0105   ALBUMIN 2.3 (L) 09/02/2015 0105   AST 23 09/02/2015 0105   ALT 20 09/02/2015 0105   ALKPHOS 51 09/02/2015 0105   BILITOT 1.3 (H) 09/02/2015 0105   GFRNONAA >60 09/05/2015 0336   GFRAA >60 09/05/2015 0336    GFR Estimated Creatinine Clearance: 128.6 mL/min (by C-G formula based on SCr of 0.8 mg/dL). No results for input(s): LIPASE, AMYLASE in the last 72 hours. No results for input(s): CKTOTAL, CKMB, CKMBINDEX, TROPONINI in the last 72 hours. Invalid input(s): POCBNP No results for input(s): DDIMER in the last 72 hours. No results for input(s): HGBA1C in the last 72 hours. No results for input(s): CHOL, HDL, LDLCALC, TRIG, CHOLHDL, LDLDIRECT in the last 72 hours. No results for input(s): TSH, T4TOTAL, T3FREE, THYROIDAB in the last 72 hours.  Invalid input(s): FREET3 No results for input(s): VITAMINB12, FOLATE, FERRITIN, TIBC, IRON, RETICCTPCT in the last 72 hours. Coags: No results for input(s): INR in the last 72 hours.  Invalid input(s): PT Microbiology: Recent Results (from the past 240 hour(s))  Culture, blood (routine x 2)     Status: None   Collection Time: 08/31/15  6:40 PM  Result Value Ref Range Status   Specimen Description BLOOD LEFT  ANTECUBITAL  Final   Special Requests BOTTLES DRAWN AEROBIC AND ANAEROBIC 5CC  Final   Culture   Final    NO GROWTH 5 DAYS Performed at [facility]    Report Status 09/05/2015 FINAL  Final  Culture, blood (routine x 2)     Status: None   Collection Time: 08/31/15  7:00 PM  Result Value Ref Range Status   Specimen Description BLOOD RIGHT HAND  Final   Special Requests BOTTLES DRAWN AEROBIC ONLY 3CC  Final   Culture   Final    NO GROWTH 5 DAYS Performed at [facility]    Report Status 09/05/2015 FINAL  Final  Blood culture (routine x 2)     Status: None (Preliminary result)   Collection Time: 08/31/15 10:15 PM  Result Value Ref Range Status   Specimen Description BLOOD RIGHT ANTECUBITAL  Final   Special Requests BOTTLES DRAWN AEROBIC AND ANAEROBIC 5CC  Final   Culture   Final    NO GROWTH 4 DAYS Performed at [facility]    Report Status PENDING  Incomplete  Blood culture (routine x 2)     Status: None (Preliminary result)   Collection Time: 08/31/15 10:34 PM  Result Value Ref Range Status   Specimen Description BLOOD RIGHT WRIST  Final   Special Requests BOTTLES DRAWN AEROBIC AND ANAEROBIC 5CC  Final   Culture   Final    NO GROWTH 4 DAYS Performed at [facility]    Report Status PENDING  Incomplete  MRSA PCR Screening     Status: None   Collection Time: 09/01/15  2:01 AM  Result Value Ref Range Status   MRSA by PCR NEGATIVE NEGATIVE Final  Comment:        The GeneXpert MRSA Assay (FDA approved for NASAL specimens only), is one component of a comprehensive MRSA colonization surveillance program. It is not intended to diagnose MRSA infection nor to guide or monitor treatment for MRSA infections.   Culture, blood (routine x 2)     Status: None (Preliminary result)   Collection Time: 09/02/15 12:55 AM  Result Value Ref Range Status   Specimen Description BLOOD RIGHT HAND  Final   Special Requests IN PEDIATRIC BOTTLE 2CC  Final    Culture NO GROWTH 3 DAYS  Final   Report Status PENDING  Incomplete  Culture, blood (routine x 2)     Status: None (Preliminary result)   Collection Time: 09/02/15  1:08 AM  Result Value Ref Range Status   Specimen Description BLOOD LEFT ARM  Final   Special Requests BOTTLES DRAWN AEROBIC AND ANAEROBIC 5CC  Final   Culture NO GROWTH 3 DAYS  Final   Report Status PENDING  Incomplete  Culture, blood (single)     Status: None (Preliminary result)   Collection Time: 09/02/15  8:23 PM  Result Value Ref Range Status   Specimen Description BLOOD LEFT HAND  Final   Special Requests IN PEDIATRIC BOTTLE 1CC  Final   Culture NO GROWTH 3 DAYS  Final   Report Status PENDING  Incomplete    FURTHER DISCHARGE INSTRUCTIONS:  Get Medicines reviewed and adjusted: Please take all your medications with you for your next visit with your Primary MD  Laboratory/radiological data: Please request your Primary MD to go over all hospital tests and procedure/radiological results at the follow up, please ask your Primary MD to get all Hospital records sent to his/her office.  In some cases, they will be blood work, cultures and biopsy results pending at the time of your discharge. Please request that your primary care M.D. goes through all the records of your hospital data and follows up on these results.  Also Note the following: If you experience worsening of your admission symptoms, develop shortness of breath, life threatening emergency, suicidal or homicidal thoughts you must seek medical attention immediately by calling 911 or calling your MD immediately  if symptoms less severe.  You must read complete instructions/literature along with all the possible adverse reactions/side effects for all the Medicines you take and that have been prescribed to you. Take any new Medicines after you have completely understood and accpet all the possible adverse reactions/side effects.   Do not drive when taking Pain  medications or sleeping medications (Benzodaizepines)  Do not take more than prescribed Pain, Sleep and Anxiety Medications. It is not advisable to combine anxiety,sleep and pain medications without talking with your primary care practitioner  Special Instructions: If you have smoked or chewed Tobacco  in the last 2 yrs please stop smoking, stop any regular Alcohol  and or any Recreational drug use.  Wear Seat belts while driving.  Please note: You were cared for by a hospitalist during your hospital stay. Once you are discharged, your primary care physician will handle any further medical issues. Please note that NO REFILLS for any discharge medications will be authorized once you are discharged, as it is imperative that you return to your primary care physician (or establish a relationship with a primary care physician if you do not have one) for your post hospital discharge needs so that they can reassess your need for medications and monitor your lab values.  Total Time spent  coordinating discharge including counseling, education and face to face time equals 25  minutes.  SignedJeoffrey Davis 09/06/2015 10:16 AM

## 2015-09-06 NOTE — Hospital Discharge Follow-Up (Signed)
This Case Manager received call from Tomi BambergerBrenda Graves-Bigelow, RN CM who indicated patient needing a hospital follow-up appointment. 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  Hospital follow-up appointment scheduled for 09/12/15 at 1500 with Dr. Cato MulliganSwords. AVS updated. Tomi BambergerBrenda Graves-Bigelow, RN CM also updated. She indicated she will inform patient of his appointment and clinic's pharmacy resources. No additional needs identified at this time.

## 2015-09-06 NOTE — Plan of Care (Signed)
Problem: Education: Goal: Knowledge of [facility] Education information/materials will improve Outcome: Completed/Met Date Met: 09/06/15 Pt educated throughout entire hospitalization regarding tests, procedures, labs, medications, and available resources.   Problem: Safety: Goal: Ability to remain free from injury will improve Outcome: Completed/Met Date Met: 09/06/15 Pt has remained free from injury   Problem: Physical Regulation: Goal: Will remain free from infection Outcome: Completed/Met Date Met: 09/06/15 Pt has remained free from infection

## 2015-09-06 NOTE — Clinical Social Work Note (Signed)
Clinical Social Worker following for potential SNF placement due to history of IV drug use.  Per MD note today, patient will transition to oral antibiotics and discharge home on them for 10 days.  SNF placement no longer necessary for patient at this time.  Clinical Social Worker will sign off for now as social work intervention is no longer needed. Please consult us again if new need arises.  Macario GoldsJesse Lunden [place], [place] [phone]

## 2015-09-07 LAB — CULTURE, BLOOD (ROUTINE X 2)
CULTURE: NO GROWTH
Culture: NO GROWTH

## 2015-09-07 LAB — CULTURE, BLOOD (SINGLE): CULTURE: NO GROWTH

## 2015-09-12 ENCOUNTER — Ambulatory Visit: Payer: Self-pay | Attending: Internal Medicine | Admitting: Internal Medicine

## 2015-09-12 DIAGNOSIS — J189 Pneumonia, unspecified organism: Secondary | ICD-10-CM

## 2015-09-12 DIAGNOSIS — F17210 Nicotine dependence, cigarettes, uncomplicated: Secondary | ICD-10-CM | POA: Insufficient documentation

## 2015-09-12 DIAGNOSIS — F1910 Other psychoactive substance abuse, uncomplicated: Secondary | ICD-10-CM

## 2015-09-12 DIAGNOSIS — K219 Gastro-esophageal reflux disease without esophagitis: Secondary | ICD-10-CM | POA: Insufficient documentation

## 2015-09-12 NOTE — Assessment & Plan Note (Signed)
He notes that he has stopped all drug use Discussed ivda- he has quit

## 2015-09-12 NOTE — Assessment & Plan Note (Signed)
Had septic emboli Treating with po abx finsih abx We will see in 4 weeks  See AVS for return to work

## 2015-09-12 NOTE — Progress Notes (Signed)
Patient is here for HFU  Patient denies pain at this time  Patient has taken medication today and patient has eaten today.  BHS-  Feels well, no complaints. Had recent hospitalization with presumed embolic infection to lungs. Secondary to ivda Had exho- no vegetations He is tolerating po ABX- feels well- eager to go back to work  Appetite normal Activity normal  Past Medical History:  Diagnosis Date  . Alcohol abuse   . Cocaine abuse   . GERD (gastroesophageal reflux disease)   . IVDU (intravenous drug user)   . Marijuana abuse   . Tobacco abuse     Social History   Social History  . Marital status: Single    Spouse name: N/A  . Number of children: N/A  . Years of education: N/A   Occupational History  . Not on file.   Social History Main Topics  . Smoking status: Current Some Day Smoker  . Smokeless tobacco: Never Used  . Alcohol use Yes  . Drug use:     Types: Marijuana  . Sexual activity: Not on file   Other Topics Concern  . Not on file   Social History Narrative  . No narrative on file    Past Surgical History:  Procedure Laterality Date  . TYMPANOSTOMY TUBE PLACEMENT      No family history on file.  No Known Allergies  Current Outpatient Prescriptions on File Prior to Visit  Medication Sig Dispense Refill  . amoxicillin-clavulanate (AUGMENTIN) 875-125 MG tablet Take 1 tablet by mouth 2 (two) times daily. 20 tablet 0  . doxycycline (VIBRAMYCIN) 100 MG capsule Take 1 capsule (100 mg total) by mouth 2 (two) times daily. 20 capsule 0   No current facility-administered medications on file prior to visit.      patient denies chest pain, shortness of breath, orthopnea. Denies lower extremity edema, abdominal pain, change in appetite, change in bowel movements. Patient denies rashes, musculoskeletal complaints. No other specific complaints in a complete review of systems.   BP 108/70  HR- 86 nad- chest- CTA cv- reg Rate ABD- soft, NT Ext- no  edema  Community acquired pneumonia Had septic emboli Treating with po abx finsih abx We will see in 4 weeks  See AVS for return to work  Drug abuse He notes that he has stopped all drug use Discussed ivda- he has quit

## 2015-09-12 NOTE — Patient Instructions (Signed)
Ok to return to work "light duty" for one week than return to full time work.

## 2016-05-05 ENCOUNTER — Encounter (HOSPITAL_COMMUNITY): Payer: Self-pay | Admitting: Emergency Medicine

## 2016-05-05 ENCOUNTER — Emergency Department (HOSPITAL_COMMUNITY)
Admission: EM | Admit: 2016-05-05 | Discharge: 2016-05-06 | Disposition: A | Discharge status: Home / Self Care | Payer: Self-pay | Attending: Emergency Medicine | Admitting: Emergency Medicine

## 2016-05-05 DIAGNOSIS — F17210 Nicotine dependence, cigarettes, uncomplicated: Secondary | ICD-10-CM | POA: Insufficient documentation

## 2016-05-05 DIAGNOSIS — T50901A Poisoning by unspecified drugs, medicaments and biological substances, accidental (unintentional), initial encounter: Secondary | ICD-10-CM | POA: Insufficient documentation

## 2016-05-05 LAB — COMPREHENSIVE METABOLIC PANEL
ALT: 158 U/L — ABNORMAL HIGH (ref 17–63)
AST: 73 U/L — ABNORMAL HIGH (ref 15–41)
Albumin: 4.4 g/dL (ref 3.5–5.0)
Alkaline Phosphatase: 76 U/L (ref 38–126)
Anion gap: 10 (ref 5–15)
BUN: 13 mg/dL (ref 6–20)
CO2: 25 mmol/L (ref 22–32)
Calcium: 8.8 mg/dL — ABNORMAL LOW (ref 8.9–10.3)
Chloride: 105 mmol/L (ref 101–111)
Creatinine, Ser: 1.01 mg/dL (ref 0.61–1.24)
GFR calc Af Amer: >60 mL/min (ref >60)
GFR calc non Af Amer: >60 mL/min (ref >60)
Glucose, Bld: 86 mg/dL (ref 65–99)
Potassium: 5.6 mmol/L — ABNORMAL HIGH (ref 3.5–5.1)
Sodium: 140 mmol/L (ref 135–145)
Total Bilirubin: 2 mg/dL — ABNORMAL HIGH (ref 0.3–1.2)
Total Protein: 7.4 g/dL (ref 6.5–8.1)

## 2016-05-05 LAB — ETHANOL: Alcohol, Ethyl (B): <5 mg/dL (ref <5)

## 2016-05-05 LAB — CBC
HCT: 44.5 % (ref 39.0–52.0)
Hemoglobin: 15.5 g/dL (ref 13.0–17.0)
MCH: 31.4 pg (ref 26.0–34.0)
MCHC: 34.8 g/dL (ref 30.0–36.0)
MCV: 90.3 fL (ref 78.0–100.0)
Platelets: 116 10*3/uL — ABNORMAL LOW (ref 150–400)
RBC: 4.93 MIL/uL (ref 4.22–5.81)
RDW: 13.2 % (ref 11.5–15.5)
WBC: 10.9 10*3/uL — ABNORMAL HIGH (ref 4.0–10.5)

## 2016-05-05 MED ORDER — SODIUM BICARBONATE 8.4 % IV SOLN: 50.0000 meq | Freq: Once | INTRAVENOUS | Status: DC

## 2016-05-05 MED ORDER — INSULIN ASPART 100 UNIT/ML ~~LOC~~ SOLN: 10.0000 [IU] | Freq: Once | SUBCUTANEOUS | Status: DC

## 2016-05-05 MED ORDER — DEXTROSE 50 % IV SOLN: 50.0000 mL | Freq: Once | INTRAVENOUS | Status: DC

## 2016-05-05 MED ORDER — SODIUM CHLORIDE 0.9 % IV BOLUS (SEPSIS)
2000.0000 mL | Freq: Once | INTRAVENOUS | Status: AC
  Administered 2016-05-05: 2000 mL via INTRAVENOUS

## 2016-05-05 MED ORDER — SODIUM CHLORIDE 0.9 % IV SOLN
1.0000 g | Freq: Once | INTRAVENOUS | Status: DC
  Filled 2016-05-05: qty 10

## 2016-05-05 NOTE — ED Notes (Signed)
Bed: BM84 Expected date:  Expected time:  Means of arrival:  Comments: EMS 26 yo male snoring/agonal resp-Narcan 4 mg-drug overdose ? xanax

## 2016-05-05 NOTE — ED Triage Notes (Signed)
PT presents by EMS for drug overdose. EMS advised pt had agonal respirations on arrival and after receiving Narcan  pt became more responsive. Pt repeating same questions about situation, time and place. EMS advised that CPR was done on scene and crushed white powder found near pt. Unknown of what the substance was. Pt currently alert and oriented x 1.

## 2016-05-05 NOTE — ED Provider Notes (Signed)
[facility]-EMERGENCY DEPT Provider Note   CSN: [id number] Arrival date & time: 05/05/16  2151     History   Chief Complaint Chief Complaint  Patient presents with  . Drug Overdose    HPI Kenneth Davis is a 26 y.o. male.  HPI   26 year old male presenting after presumed overdose. Patient has a long-standing history of substance abuse. Apparently was found to have a relative's home poorly responsive earlier today. He did have bystander CPR but isn't clear if he had actually lost pulses. On EMS arrival, the patient had agonal respirations. He was given 4 mg Narcan wrist clinical response. Reported there is a crushed white powder found to the patient. Patient is amnestic to the events. He does admit to using drugs but not specifically to using today.   Past Medical History:  Diagnosis Date  . Alcohol abuse   . Cocaine abuse   . GERD (gastroesophageal reflux disease)   . IVDU (intravenous drug user)   . Marijuana abuse   . Tobacco abuse     Patient Active Problem List   Diagnosis Date Noted  . Drug abuse   . Endocarditis 09/01/2015  . Sepsis (HCC) 09/01/2015  . Community acquired pneumonia 09/01/2015  . Chest pain 09/01/2015  . Cocaine abuse   . Marijuana abuse   . Tobacco abuse   . IVDU (intravenous drug user)   . Alcohol abuse     Past Surgical History:  Procedure Laterality Date  . TYMPANOSTOMY TUBE PLACEMENT         Home Medications    Prior to Admission medications   Medication Sig Start Date End Date Taking? Authorizing Provider  amoxicillin-clavulanate (AUGMENTIN) 875-125 MG tablet Take 1 tablet by mouth 2 (two) times daily. Patient not taking: Reported on 05/05/2016 09/06/15   Maretta Bees, MD  doxycycline (VIBRAMYCIN) 100 MG capsule Take 1 capsule (100 mg total) by mouth 2 (two) times daily. Patient not taking: Reported on 05/05/2016 09/06/15   Maretta Bees, MD    Family History History reviewed. No pertinent family history.  Social  History Social History  Substance Use Topics  . Smoking status: Current Some Day Smoker    Packs/day: 1.00    Types: Cigarettes  . Smokeless tobacco: Never Used  . Alcohol use Yes     Allergies   Patient has no known allergies.   Review of Systems Review of Systems   Physical Exam Updated Vital Signs BP (!) 98/57   Pulse (!) 115   Temp 98.1 F (36.7 C) (Oral)   Resp (!) 0   Ht  (1.778 m)   Wt 145 lb (65.8 kg)   SpO2 100%   BMI 20.81 kg/m   Physical Exam  Constitutional: He appears well-developed and well-nourished. No distress.  HENT:  Head: Normocephalic and atraumatic.  Eyes: Conjunctivae and EOM are normal. Pupils are equal, round, and reactive to light. Right eye exhibits no discharge. Left eye exhibits no discharge.  Neck: Neck supple.  Cardiovascular: Normal rate, regular rhythm and normal heart sounds.  Exam reveals no gallop and no friction rub.   No murmur heard. Pulmonary/Chest: Effort normal and breath sounds normal. No respiratory distress.  Abdominal: Soft. He exhibits no distension. There is no tenderness.  Musculoskeletal: He exhibits no edema or tenderness.  Neurological: He is alert. No cranial nerve deficit or sensory deficit. He exhibits normal muscle tone. Coordination normal.  Some repetitive questioning. Disoriented to time.   Skin: Skin is warm and dry.  Psychiatric: He has a normal mood and affect. His behavior is normal. Thought content normal.  Nursing note and vitals reviewed.    ED Treatments / Results  Labs (all labs ordered are listed, but only abnormal results are displayed) Labs Reviewed  COMPREHENSIVE METABOLIC PANEL - Abnormal; Notable for the following:       Result Value   Potassium 5.6 (*)    Calcium 8.8 (*)    AST 73 (*)    ALT 158 (*)    Total Bilirubin 2.0 (*)    All other components within normal limits  CBC - Abnormal; Notable for the following:    WBC 10.9 (*)    Platelets 116 (*)    All other  components within normal limits  RAPID URINE DRUG SCREEN, HOSP PERFORMED - Abnormal; Notable for the following:    Cocaine POSITIVE (*)    Benzodiazepines POSITIVE (*)    Tetrahydrocannabinol POSITIVE (*)    All other components within normal limits  ETHANOL  POTASSIUM    EKG  EKG Interpretation  Date/Time:  Monday May 05 2016 22:01:44 EDT Ventricular Rate:  116 PR Interval:    QRS Duration: 93 QT Interval:  287 QTC Calculation: 399 R Axis:   95 Text Interpretation:  Sinus tachycardia Borderline right axis deviation ST elev, probable normal early repol pattern Confirmed by Juleen China  MD, Jeferson Boozer ([id number]) on 05/05/2016 11:04:54 PM       Radiology No results found.  Procedures Procedures (including critical care time)  Medications Ordered in ED Medications - No data to display   Initial Impression / Assessment and Plan / ED Course  I have reviewed the triage vital signs and the nursing notes.  Pertinent labs & imaging results that were available during my care of the patient were reviewed by me and considered in my medical decision making (see chart for details).     26 year old male with likely drug overdose. Respirations seen. Breasts clinical response to Narcan. Long-standing history of substance abuse. Now he is awake and following commands. Remains somewhat disoriented though. Reported trauma. No signs of exam. We'll continue to observe and check basic labs. Anticipate discharge after a period of observation.  Potassium elevated. Probably real. I'm not sure why though. Will observe. Repeat labs. Anticipate discharge if K better.   Final Clinical Impressions(s) / ED Diagnoses   Final diagnoses:  Accidental drug overdose, initial encounter    New Prescriptions New Prescriptions   No medications on file     Raeford Razor, MD 05/11/16 1142

## 2016-05-06 LAB — RAPID URINE DRUG SCREEN, HOSP PERFORMED
Amphetamines: NOT DETECTED
Barbiturates: NOT DETECTED
Benzodiazepines: POSITIVE — AB
Cocaine: POSITIVE — AB
Opiates: NOT DETECTED
Tetrahydrocannabinol: POSITIVE — AB

## 2016-05-06 LAB — POTASSIUM: Potassium: 4.2 mmol/L (ref 3.5–5.1)

## 2016-05-06 NOTE — ED Notes (Signed)
Pt ambulated around department without any difficulty.

## 2016-05-06 NOTE — Discharge Instructions (Signed)
Follow-up with your primary Dr. in the next week, and return to the emergency department if you experience additional problems.

## 2016-05-06 NOTE — ED Provider Notes (Signed)
Care assumed from Dr. Juleen China at shift change. Patient brought here after an apparent overdose of alcohol and benzodiazepines. He apparently took Xanax. Care was signed out to me awaiting a repeat potassium level as his initial level was reported as high. He was given 2 L of normal saline, and observed in the emergency department for several hours. This was repeated and was found to be normal at 4.2. He is resting comfortably and is in no distress. I feel as though he is appropriate for discharge. His father is present at bedside and is comfortable with the disposition. He is to return as needed for any problems.   Geoffery Lyons, MD 05/06/16 [phone]

## 2016-05-06 NOTE — ED Notes (Signed)
Dr.Delo notified of pt blood pressure.

## 2016-05-06 NOTE — ED Notes (Signed)
Pt tolerating PO fluids at this time. Dr.Delo aware.

## 2016-05-06 NOTE — ED Notes (Signed)
Pt discharged in care of family members. No questions at time of discharge.

## 2016-12-31 ENCOUNTER — Emergency Department (HOSPITAL_BASED_OUTPATIENT_CLINIC_OR_DEPARTMENT_OTHER): Admission: EM | Admit: 2016-12-31 | Payer: Self-pay | Source: Home / Self Care

## 2016-12-31 NOTE — ED Notes (Signed)
Pt called multiple times to triage with no answer not found in lobby or bistro

## 2017-03-08 ENCOUNTER — Other Ambulatory Visit: Payer: Self-pay

## 2017-03-08 ENCOUNTER — Encounter (HOSPITAL_COMMUNITY): Payer: Self-pay

## 2017-03-08 ENCOUNTER — Emergency Department (HOSPITAL_COMMUNITY): Payer: Self-pay

## 2017-03-08 ENCOUNTER — Inpatient Hospital Stay (HOSPITAL_COMMUNITY)
Admission: EM | Admit: 2017-03-08 | Discharge: 2017-03-09 | DRG: 917 | Discharge status: Against Medical Advice | Payer: Self-pay | Attending: Internal Medicine | Admitting: Internal Medicine

## 2017-03-08 DIAGNOSIS — Z5321 Procedure and treatment not carried out due to patient leaving prior to being seen by health care provider: Secondary | ICD-10-CM

## 2017-03-08 DIAGNOSIS — R531 Weakness: Secondary | ICD-10-CM

## 2017-03-08 DIAGNOSIS — F1110 Opioid abuse, uncomplicated: Secondary | ICD-10-CM | POA: Diagnosis present

## 2017-03-08 DIAGNOSIS — F1410 Cocaine abuse, uncomplicated: Secondary | ICD-10-CM | POA: Diagnosis present

## 2017-03-08 DIAGNOSIS — M6282 Rhabdomyolysis: Secondary | ICD-10-CM | POA: Diagnosis present

## 2017-03-08 DIAGNOSIS — T50901A Poisoning by unspecified drugs, medicaments and biological substances, accidental (unintentional), initial encounter: Principal | ICD-10-CM

## 2017-03-08 DIAGNOSIS — R7989 Other specified abnormal findings of blood chemistry: Secondary | ICD-10-CM

## 2017-03-08 DIAGNOSIS — G9340 Encephalopathy, unspecified: Secondary | ICD-10-CM

## 2017-03-08 DIAGNOSIS — R778 Other specified abnormalities of plasma proteins: Secondary | ICD-10-CM

## 2017-03-08 DIAGNOSIS — F17210 Nicotine dependence, cigarettes, uncomplicated: Secondary | ICD-10-CM | POA: Diagnosis present

## 2017-03-08 DIAGNOSIS — I38 Endocarditis, valve unspecified: Secondary | ICD-10-CM

## 2017-03-08 DIAGNOSIS — G92 Toxic encephalopathy: Secondary | ICD-10-CM | POA: Diagnosis present

## 2017-03-08 DIAGNOSIS — I2690 Septic pulmonary embolism without acute cor pulmonale: Secondary | ICD-10-CM | POA: Diagnosis present

## 2017-03-08 DIAGNOSIS — J690 Pneumonitis due to inhalation of food and vomit: Secondary | ICD-10-CM

## 2017-03-08 DIAGNOSIS — R945 Abnormal results of liver function studies: Secondary | ICD-10-CM

## 2017-03-08 DIAGNOSIS — R000 Tachycardia, unspecified: Secondary | ICD-10-CM

## 2017-03-08 DIAGNOSIS — F1990 Other psychoactive substance use, unspecified, uncomplicated: Secondary | ICD-10-CM | POA: Diagnosis present

## 2017-03-08 DIAGNOSIS — F1210 Cannabis abuse, uncomplicated: Secondary | ICD-10-CM | POA: Diagnosis present

## 2017-03-08 DIAGNOSIS — I639 Cerebral infarction, unspecified: Secondary | ICD-10-CM

## 2017-03-08 DIAGNOSIS — J9601 Acute respiratory failure with hypoxia: Secondary | ICD-10-CM

## 2017-03-08 DIAGNOSIS — F1910 Other psychoactive substance abuse, uncomplicated: Secondary | ICD-10-CM | POA: Diagnosis present

## 2017-03-08 LAB — CBC WITH DIFFERENTIAL/PLATELET
BASOS ABS: 0 10*3/uL (ref 0.0–0.1)
BASOS PCT: 0 %
EOS PCT: 0 %
Eosinophils Absolute: 0 10*3/uL (ref 0.0–0.7)
HCT: 50.2 % (ref 39.0–52.0)
Hemoglobin: 16.9 g/dL (ref 13.0–17.0)
Lymphocytes Relative: 6 %
Lymphs Abs: 0.8 10*3/uL (ref 0.7–4.0)
MCH: 31.1 pg (ref 26.0–34.0)
MCHC: 33.7 g/dL (ref 30.0–36.0)
MCV: 92.3 fL (ref 78.0–100.0)
MONO ABS: 1.4 10*3/uL — AB (ref 0.1–1.0)
Monocytes Relative: 11 %
NEUTROS ABS: 11.1 10*3/uL — AB (ref 1.7–7.7)
Neutrophils Relative %: 83 %
PLATELETS: 162 10*3/uL (ref 150–400)
RBC: 5.44 MIL/uL (ref 4.22–5.81)
RDW: 13.7 % (ref 11.5–15.5)
WBC: 13.3 10*3/uL — ABNORMAL HIGH (ref 4.0–10.5)

## 2017-03-08 LAB — COMPREHENSIVE METABOLIC PANEL
ALBUMIN: 3.8 g/dL (ref 3.5–5.0)
ALT: 49 U/L (ref 17–63)
AST: 53 U/L — ABNORMAL HIGH (ref 15–41)
Alkaline Phosphatase: 63 U/L (ref 38–126)
Anion gap: 10 (ref 5–15)
BILIRUBIN TOTAL: 0.6 mg/dL (ref 0.3–1.2)
BUN: 12 mg/dL (ref 6–20)
CO2: 23 mmol/L (ref 22–32)
CREATININE: 1.24 mg/dL (ref 0.61–1.24)
Calcium: 8.6 mg/dL — ABNORMAL LOW (ref 8.9–10.3)
Chloride: 102 mmol/L (ref 101–111)
GFR calc Af Amer: >60 mL/min (ref >60)
GLUCOSE: 144 mg/dL — AB (ref 65–99)
POTASSIUM: 5.2 mmol/L — AB (ref 3.5–5.1)
Sodium: 135 mmol/L (ref 135–145)
TOTAL PROTEIN: 7.2 g/dL (ref 6.5–8.1)

## 2017-03-08 LAB — I-STAT CHEM 8, ED
BUN: 12 mg/dL (ref 6–20)
CALCIUM ION: 1.13 mmol/L — AB (ref 1.15–1.40)
CHLORIDE: 102 mmol/L (ref 101–111)
CREATININE: 1.2 mg/dL (ref 0.61–1.24)
GLUCOSE: 126 mg/dL — AB (ref 65–99)
HCT: 48 % (ref 39.0–52.0)
Hemoglobin: 16.3 g/dL (ref 13.0–17.0)
POTASSIUM: 5.1 mmol/L (ref 3.5–5.1)
Sodium: 137 mmol/L (ref 135–145)
TCO2: 24 mmol/L (ref 22–32)

## 2017-03-08 LAB — RAPID URINE DRUG SCREEN, HOSP PERFORMED
Amphetamines: NOT DETECTED
BENZODIAZEPINES: POSITIVE — AB
Barbiturates: NOT DETECTED
COCAINE: POSITIVE — AB
OPIATES: POSITIVE — AB
Tetrahydrocannabinol: POSITIVE — AB

## 2017-03-08 LAB — ETHANOL: Alcohol, Ethyl (B): <10 mg/dL (ref <10)

## 2017-03-08 LAB — BRAIN NATRIURETIC PEPTIDE: B Natriuretic Peptide: 249 pg/mL — ABNORMAL HIGH (ref 0.0–100.0)

## 2017-03-08 LAB — MRSA PCR SCREENING: MRSA by PCR: NEGATIVE

## 2017-03-08 LAB — TROPONIN I: TROPONIN I: 0.12 ng/mL — AB (ref <0.03)

## 2017-03-08 LAB — TSH: TSH: 0.166 u[IU]/mL — ABNORMAL LOW (ref 0.350–4.500)

## 2017-03-08 MED ORDER — PIPERACILLIN-TAZOBACTAM 3.375 G IVPB
3.3750 g | Freq: Three times a day (TID) | INTRAVENOUS | Status: DC
  Administered 2017-03-08 – 2017-03-09 (×2): 3.375 g via INTRAVENOUS
  Filled 2017-03-08 (×2): qty 50

## 2017-03-08 MED ORDER — IOPAMIDOL (ISOVUE-370) INJECTION 76%
80.0000 mL | Freq: Once | INTRAVENOUS | Status: AC | PRN
  Administered 2017-03-08: 80 mL via INTRAVENOUS

## 2017-03-08 MED ORDER — ENOXAPARIN SODIUM 40 MG/0.4ML ~~LOC~~ SOLN: 40.0000 mg | SUBCUTANEOUS | Status: DC

## 2017-03-08 MED ORDER — SODIUM CHLORIDE 0.9 % IV SOLN
500.0000 mg | INTRAVENOUS | Status: DC
  Administered 2017-03-08: 500 mg via INTRAVENOUS
  Filled 2017-03-08 (×3): qty 500

## 2017-03-08 MED ORDER — PIPERACILLIN-TAZOBACTAM 3.375 G IVPB 30 MIN
3.3750 g | Freq: Once | INTRAVENOUS | Status: AC
  Administered 2017-03-08: 3.375 g via INTRAVENOUS
  Filled 2017-03-08: qty 50

## 2017-03-08 MED ORDER — VANCOMYCIN HCL IN DEXTROSE 750-5 MG/150ML-% IV SOLN
750.0000 mg | Freq: Three times a day (TID) | INTRAVENOUS | Status: DC
  Administered 2017-03-08 – 2017-03-09 (×2): 750 mg via INTRAVENOUS
  Filled 2017-03-08 (×8): qty 150

## 2017-03-08 MED ORDER — SODIUM CHLORIDE 0.9 % IV SOLN
INTRAVENOUS | Status: DC
  Administered 2017-03-08: 1000 mL via INTRAVENOUS

## 2017-03-08 MED ORDER — ONDANSETRON HCL 4 MG/2ML IJ SOLN
4.0000 mg | Freq: Four times a day (QID) | INTRAMUSCULAR | Status: DC | PRN
  Administered 2017-03-08: 4 mg via INTRAVENOUS
  Filled 2017-03-08: qty 2

## 2017-03-08 MED ORDER — HYDROCODONE-ACETAMINOPHEN 5-325 MG PO TABS
1.0000 | ORAL_TABLET | Freq: Once | ORAL | Status: AC
  Administered 2017-03-08: 1 via ORAL
  Filled 2017-03-08: qty 1

## 2017-03-08 MED ORDER — VANCOMYCIN HCL IN DEXTROSE 1-5 GM/200ML-% IV SOLN
1000.0000 mg | Freq: Once | INTRAVENOUS | Status: AC
  Administered 2017-03-08: 1000 mg via INTRAVENOUS
  Filled 2017-03-08: qty 200

## 2017-03-08 NOTE — ED Notes (Signed)
CRITICAL VALUE ALERT  Critical Value:  Troponin 0.12  Date & Time Notied:  03/08/17 @ 1438  Provider Notified: Dr. Rubin PayorPickering  Orders Received/Actions taken: No new orders obtained

## 2017-03-08 NOTE — ED Notes (Signed)
Patient currently on 6 LPM, sats 89%. Dr. Rubin PayorPickering made aware. No new orders obtained.

## 2017-03-08 NOTE — Progress Notes (Signed)
Patient stated "I should've stayed home and died". Notified [employer] RN

## 2017-03-08 NOTE — Progress Notes (Signed)
RT attempted to place O2 on patient due to sats dropping in the 70's. Patient refuses to wear. RT educated patient on the damage that could be done if sats continue to drop and patient still refuses. RN aware.

## 2017-03-08 NOTE — ED Triage Notes (Addendum)
[facility].  Patient possibly overdosed on heroin, horse tranquilizer, cocaine per girl on scene per EMS. Patient unable to hear anything at this time. Patient unable to extend right leg without assistance. Sats 83% on RA. Patient alert at this time. Dr. Rubin PayorPickering at bedside.

## 2017-03-08 NOTE — Progress Notes (Signed)
Pt very hungry. Becoming verbally aggressive about the situation. MD paged for diet order change. Pt is alert and oriented. Verbally aggressive but easily calmed. Pt states he is leaving tomorrow regardless of what the doctor says. Explained to pt about his abnormal labs, requirement for oxygen use, and need for abx to treat his PNA. Pt does not want to wear his oxygen, but RN able to talk him into keeping it on for now.   Will continue to monitor.

## 2017-03-08 NOTE — Progress Notes (Signed)
Pt given tv dinner and jello. Pt still very upset about not being able to go home and about being suicide precaution. Pt stated he never siad he wanted to hurt himself and he only made that statement earlier because staff was coming in the room bothering him while he was trying to rest. Pt does not seem suicidal/homicidal.  Pt refusing to wear O2. Saturations in the 60's. Pt is aware and still refuses to wear O2.  Will continue to monitor

## 2017-03-08 NOTE — H&P (Addendum)
TRH H&P   Patient Demographics:    Kenneth Davis, is a 27 y.o. male  MRN: [id number]   DOB - 1990-06-20  Admit Date - 03/08/2017  Outpatient Primary MD for the patient is Patient, No Pcp Per  Referring MD/NP/PA: Davonna Belling  Outpatient Specialists:     Patient coming from: home  Chief Complaint  Patient presents with  . Drug Overdose      HPI:    Kenneth Davis  is a 27 y.o. male, w hx of ivda (heroin, coccaine) who apparently c/o overdose.  Pt was trying to "get high"  Smoked crack , ingested xanax, and ? Horse tranquillizer.  Pt was hypotensive and hypoxic,  Reports cough x 4 weeks, slight yellow sputum, and n/v x1 yesterday.   Pt has difficulty moving right distal lower ext.  Pt was brought for overdose.   In Ed,  CXR IMPRESSION: Bilateral interstitial and alveolar airspace opacities throughout bilateral lungs. Differential considerations include pulmonary edema versus multilobar pneumonia.  CT brain / CTA neck IMPRESSION: 1. Normal arterial findings on CTA head and neck and normal CT appearance of the brain. 2. Abnormal lungs with widespread bilateral centrilobular sub solid pulmonary opacity. Consider acute pulmonary edema in this clinical setting, but differential diagnosis would include acute viral/atypical respiratory infection. 3. Small partially visible cavitary lesion in the anterior right upper lobe, probably postinflammatory.  UDS opiates +, coccaine+, benzo+, marijuana +  Etoh <10  Wbc 13.3, Hgb 16.9 Plt 162  Trop 0.12 Na 135, K 5.2, Bun 12, Creatinine 1.24 Ast 53, Alt 49  Pt will be admitted for bilatera lung infiltrate ? Pneumonia , hyperkalemia, abnormal liver function,  Intentional overdose to get high and troponin elevation.     Review of systems:    In addition to the HPI above,  No Fever-chills, No Headache, No changes with  Vision or hearing, No problems swallowing food or Liquids, No Chest pain,  No Abdominal pain, No Nausea or Vommitting, Bowel movements are regular, No Blood in stool or Urine, No dysuria, No new skin rashes or bruises, No new joints pains-aches,  No new weakness, tingling, numbness in any extremity, No recent weight gain or loss, No polyuria, polydypsia or polyphagia, No significant Mental Stressors.  A full 10 point Review of Systems was done, except as stated above, all other Review of Systems were negative.   With Past History of the following :    Past Medical History:  Diagnosis Date  . Alcohol abuse   . Cocaine abuse ([place])   . GERD (gastroesophageal reflux disease)   . IVDU (intravenous drug user)   . Marijuana abuse   . Tobacco abuse       Past Surgical History:  Procedure Laterality Date  . TYMPANOSTOMY TUBE PLACEMENT        Social History:     Social History   Tobacco Use  .  Smoking status: Current Some Day Smoker    Packs/day: 1.00    Types: Cigarettes  . Smokeless tobacco: Never Used  Substance Use Topics  . Alcohol use: Yes     Lives - at home  Mobility - walks by self at baseline   Family History :     Family History  Family history unknown: Yes   Pt doesn't recall if mother and father have any medical issues   Home Medications:   Prior to Admission medications   Not on File     Allergies:    No Known Allergies   Physical Exam:   Vitals  Blood pressure 125/87, pulse 98, temperature 98.5 F (36.9 C), temperature source Oral, resp. rate (!) 24, height 5' 10"  (1.778 m), weight 65.8 kg (145 lb), SpO2 95 %.   1. General  lying in bed in NAD     2. Normal affect and insight, Not Suicidal or Homicidal, Awake Alert, Oriented X 3.  3. No F.N deficits, ALL C.Nerves Intact, Strength 5/5 all 4 extremities, Sensation intact all 4 extremities, Plantars down going.  4. Ears and Eyes appear Normal, Conjunctivae clear, PERRLA. Moist  Oral Mucosa.  5. Supple Neck, No JVD, No cervical lymphadenopathy appriciated, No Carotid Bruits.  6. Symmetrical Chest wall movement, Good air movement bilaterally, slight crackles bilateral base, no wheezing  7. RRR, No Gallops, Rubs or Murmurs, No Parasternal Heave.  8. Positive Bowel Sounds, Abdomen Soft, No tenderness, No organomegaly appriciated,No rebound -guarding or rigidity.  9.  No Cyanosis, Normal Skin Turgor, No Skin Rash or Bruise.  10. Good muscle tone,  joints appear normal , no effusions, Normal ROM.  11. No Palpable Lymph Nodes in Neck or Axillae  No janeway, no osler, no splinter  Flexes right distal lower ext at knee but can't extend per pt   Data Review:    CBC Recent Labs  Lab 03/08/17 1320 03/08/17 1349  WBC 13.3*  --   HGB 16.9 16.3  HCT 50.2 48.0  PLT 162  --   MCV 92.3  --   MCH 31.1  --   MCHC 33.7  --   RDW 13.7  --   LYMPHSABS 0.8  --   MONOABS 1.4*  --   EOSABS 0.0  --   BASOSABS 0.0  --    ------------------------------------------------------------------------------------------------------------------  Chemistries  Recent Labs  Lab 03/08/17 1339 03/08/17 1349  NA 135 137  K 5.2* 5.1  CL 102 102  CO2 23  --   GLUCOSE 144* 126*  BUN 12 12  CREATININE 1.24 1.20  CALCIUM 8.6*  --   AST 53*  --   ALT 49  --   ALKPHOS 63  --   BILITOT 0.6  --    ------------------------------------------------------------------------------------------------------------------ estimated creatinine clearance is 88.3 mL/min (by C-G formula based on SCr of 1.2 mg/dL). ------------------------------------------------------------------------------------------------------------------ No results for input(s): TSH, T4TOTAL, T3FREE, THYROIDAB in the last 72 hours.  Invalid input(s): FREET3  Coagulation profile No results for input(s): INR, PROTIME in the last 168  hours. ------------------------------------------------------------------------------------------------------------------- No results for input(s): DDIMER in the last 72 hours. -------------------------------------------------------------------------------------------------------------------  Cardiac Enzymes Recent Labs  Lab 03/08/17 1326  TROPONINI 0.12*   ------------------------------------------------------------------------------------------------------------------ No results found for: BNP   ---------------------------------------------------------------------------------------------------------------  Urinalysis    Component Value Date/Time   COLORURINE ORANGE (A) 08/31/2015 [place] 08/31/2015 1725   LABSPEC 1.029 08/31/2015 1725   PHURINE 6.0 08/31/2015 1725   GLUCOSEU 100 (A) 08/31/2015 1725  HGBUR NEGATIVE 08/31/2015 1725   BILIRUBINUR SMALL (A) 08/31/2015 1725   KETONESUR 15 (A) 08/31/2015 1725   PROTEINUR 100 (A) 08/31/2015 1725   NITRITE NEGATIVE 08/31/2015 1725   LEUKOCYTESUR TRACE (A) 08/31/2015 1725    ----------------------------------------------------------------------------------------------------------------   Imaging Results:    Ct Angio Head W Or Wo Contrast  Result Date: 03/08/2017 CLINICAL DATA:  27 year old male with possible drug overdose. Right lower extremity weakness. EXAM: CT ANGIOGRAPHY HEAD AND NECK TECHNIQUE: Multidetector CT imaging of the head and neck was performed using the standard protocol during bolus administration of intravenous contrast. Multiplanar CT image reconstructions and MIPs were obtained to evaluate the vascular anatomy. Carotid stenosis measurements (when applicable) are obtained utilizing NASCET criteria, using the distal internal carotid diameter as the denominator. CONTRAST:  33m ISOVUE-370 IOPAMIDOL (ISOVUE-370) INJECTION 76% COMPARISON:  None. FINDINGS: CT HEAD Brain: No midline shift,  ventriculomegaly, mass effect, evidence of mass lesion, intracranial hemorrhage or evidence of cortically based acute infarction. Gray-white matter differentiation is within normal limits throughout the brain. Normal cerebral volume. Calvarium and skull base: Negative. Paranasal sinuses: Clear. Orbits: Visualized orbits and scalp soft tissues are within normal limits. CTA NECK Skeleton: Carious dentition greater on the left side. No acute osseous abnormality identified. Upper chest: /widespread centrilobular abnormal sub solid pulmonary opacity in both lungs, greater on the left. Superimposed partially visible small cavitary lesion in the anterior right upper lobe (series 9, image 1). No superior mediastinal lymphadenopathy. Other neck: Negative.  No neck mass or lymphadenopathy. Aortic arch: The visible central pulmonary artery is patent. Three vessel arch configuration. No arch atherosclerosis or abnormality. Right carotid system: Normal. Left carotid system: Normal. Vertebral arteries: Normal right vertebral artery to the skull base. Normal left vertebral artery to the skull base. The left vertebral is mildly non dominant. Normal proximal subclavian arteries. CTA HEAD Posterior circulation: Mildly dominant distal right vertebral artery. Normal distal vertebral arteries and vertebrobasilar junction. Normal left PICA origin. The right PICA origin is late but normal. Normal vertebrobasilar junction and basilar artery. AICA, SCA, and right PCA origins are normal. There is a fetal type left PCA origin. The right posterior communicating artery is present. Bilateral PCA branches appear normal. Anterior circulation: Both ICA siphons are patent without atherosclerosis or stenosis. Normal bilateral ophthalmic and posterior communicating artery origins. Patent carotid termini. Dominant right ACA A1 segment, the left is diminutive. Anterior communicating artery and bilateral ACA branches are normal. Left MCA M1 segment, left  MCA bifurcation, and left MCA branches are normal. Right MCA M1 segment, right MCA bifurcation, and right MCA branches are normal. Venous sinuses: Patent. The left transverse and sigmoid sinus are dominant. The left IJ is dominant. Anatomic variants: Mildly dominant right vertebral artery. Dominant right and diminutive left ACA A1 segments. Fetal type left PCA origin. Delayed phase: No abnormal enhancement identified. Fifty Review of the MIP images confirms the above findings IMPRESSION: 1. Normal arterial findings on CTA head and neck and normal CT appearance of the brain. 2. Abnormal lungs with widespread bilateral centrilobular sub solid pulmonary opacity. Consider acute pulmonary edema in this clinical setting, but differential diagnosis would include acute viral/atypical respiratory infection. 3. Small partially visible cavitary lesion in the anterior right upper lobe, probably postinflammatory. Electronically Signed   By: HGenevie AnnM.D.   On: 03/08/2017 14:59   Dg Chest 1 View  Result Date: 03/08/2017 CLINICAL DATA:  Heroin overdose EXAM: PORTABLE CHEST 1 VIEW COMPARISON:  None. FINDINGS: There is bilateral interstitial and alveolar airspace  opacities throughout bilateral lungs. There is no focal consolidation. There is no pleural effusion or pneumothorax. The heart and mediastinal contours are unremarkable. The osseous structures are unremarkable. IMPRESSION: Bilateral interstitial and alveolar airspace opacities throughout bilateral lungs. Differential considerations include pulmonary edema versus multilobar pneumonia. Electronically Signed   By: Kathreen Devoid   On: 03/08/2017 14:22   Ct Angio Neck W And/or Wo Contrast  Result Date: 03/08/2017 CLINICAL DATA:  27 year old male with possible drug overdose. Right lower extremity weakness. EXAM: CT ANGIOGRAPHY HEAD AND NECK TECHNIQUE: Multidetector CT imaging of the head and neck was performed using the standard protocol during bolus administration of  intravenous contrast. Multiplanar CT image reconstructions and MIPs were obtained to evaluate the vascular anatomy. Carotid stenosis measurements (when applicable) are obtained utilizing NASCET criteria, using the distal internal carotid diameter as the denominator. CONTRAST:  58m ISOVUE-370 IOPAMIDOL (ISOVUE-370) INJECTION 76% COMPARISON:  None. FINDINGS: CT HEAD Brain: No midline shift, ventriculomegaly, mass effect, evidence of mass lesion, intracranial hemorrhage or evidence of cortically based acute infarction. Gray-white matter differentiation is within normal limits throughout the brain. Normal cerebral volume. Calvarium and skull base: Negative. Paranasal sinuses: Clear. Orbits: Visualized orbits and scalp soft tissues are within normal limits. CTA NECK Skeleton: Carious dentition greater on the left side. No acute osseous abnormality identified. Upper chest: /widespread centrilobular abnormal sub solid pulmonary opacity in both lungs, greater on the left. Superimposed partially visible small cavitary lesion in the anterior right upper lobe (series 9, image 1). No superior mediastinal lymphadenopathy. Other neck: Negative.  No neck mass or lymphadenopathy. Aortic arch: The visible central pulmonary artery is patent. Three vessel arch configuration. No arch atherosclerosis or abnormality. Right carotid system: Normal. Left carotid system: Normal. Vertebral arteries: Normal right vertebral artery to the skull base. Normal left vertebral artery to the skull base. The left vertebral is mildly non dominant. Normal proximal subclavian arteries. CTA HEAD Posterior circulation: Mildly dominant distal right vertebral artery. Normal distal vertebral arteries and vertebrobasilar junction. Normal left PICA origin. The right PICA origin is late but normal. Normal vertebrobasilar junction and basilar artery. AICA, SCA, and right PCA origins are normal. There is a fetal type left PCA origin. The right posterior  communicating artery is present. Bilateral PCA branches appear normal. Anterior circulation: Both ICA siphons are patent without atherosclerosis or stenosis. Normal bilateral ophthalmic and posterior communicating artery origins. Patent carotid termini. Dominant right ACA A1 segment, the left is diminutive. Anterior communicating artery and bilateral ACA branches are normal. Left MCA M1 segment, left MCA bifurcation, and left MCA branches are normal. Right MCA M1 segment, right MCA bifurcation, and right MCA branches are normal. Venous sinuses: Patent. The left transverse and sigmoid sinus are dominant. The left IJ is dominant. Anatomic variants: Mildly dominant right vertebral artery. Dominant right and diminutive left ACA A1 segments. Fetal type left PCA origin. Delayed phase: No abnormal enhancement identified. Fifty Review of the MIP images confirms the above findings IMPRESSION: 1. Normal arterial findings on CTA head and neck and normal CT appearance of the brain. 2. Abnormal lungs with widespread bilateral centrilobular sub solid pulmonary opacity. Consider acute pulmonary edema in this clinical setting, but differential diagnosis would include acute viral/atypical respiratory infection. 3. Small partially visible cavitary lesion in the anterior right upper lobe, probably postinflammatory. Electronically Signed   By: HGenevie AnnM.D.   On: 03/08/2017 14:59       Assessment & Plan:    Principal Problem:   Weakness Active Problems:  Cocaine abuse ([place])   IVDU (intravenous drug user)   Tachycardia   Overdose   Overdose Monitor for opioid withdrawal  Weakness right distal lower ext  Check CPK, MB MRI brain  Coccaine abuse Pt counselled on cessation  Heroin abuse Pt counselled on cessation Check ESR  Pneumonia, small cavitary area in the right upper lung ? Aspiration vs Cap Blood culture x2 Urine strep, urine legionella Sputum gramstain, culture Influenza, viral resp panel Vanco iv  pharmacy to dose Zosyn iv pharmacy to dose Zithromax 543m iv qday Pulmonary consultation Check  CT scan chest with contrast to evaluate but had iv dye load with CTA neck, will have to wait for tomorrow  Abnormal liver function Check acute hepatitis panel Check RUQ ultrasound  Tachycardia, elevated trop Trop I q6h x3, cpk, mbx1 Check tsh Check cardiac echo     DVT Prophylaxis Lovenox - SCDs  AM Labs Ordered, also please review Full Orders  Family Communication: Admission, patients condition and plan of care including tests being ordered have been discussed with the patient  who indicate understanding and agree with the plan and Code Status.  Code Status FULL CODE  Likely DC to  home  Condition GUARDED    Consults called:  Cardiology consult placed in computer  Admission status: inpatient  Time spent in minutes : 45   JJani GravelM.D on 03/08/2017 at 3:36 PM  Between 7am to 7pm - Pager - [phone]  After 7pm go to [email or web address] - password [email or web address] - Office  [phone]

## 2017-03-08 NOTE — ED Notes (Signed)
Patient applied on non-rebreather 15 lpm, sats increased to 99% on RA.

## 2017-03-08 NOTE — ED Notes (Signed)
Urine obtained  Pt denies intentional OD   Reports, "I was trying to have fin with a girl":

## 2017-03-08 NOTE — ED Notes (Signed)
Patient given water per patient request. Verified by Dr. Rubin PayorPickering.

## 2017-03-08 NOTE — ED Notes (Signed)
Patient wanting to have water. Informed patient he can not drink until scans are back. Patient voicing wanting to leave ED. Dr. Rubin PayorPickering made aware and speaking with patient.

## 2017-03-08 NOTE — ED Provider Notes (Signed)
[facility] EMERGENCY DEPARTMENT Provider Note   CSN: [id number] Arrival date & time: 03/08/17  1310     History   Chief Complaint Chief Complaint  Patient presents with  . Drug Overdose    HPI Kenneth Hackerustin Davis is a 27 y.o. male. Level 5 caveat due to intoxication HPI Patient brought in for overdose.  Reportedly had been doing heroin and potentially cocaine and horse tranquilizer.  States he had been starting today.  Had been unresponsive for EMS and sats were hypoxic.  Was hypotensive.  Pupils initially were dilated but now constricted.  Patient states he cannot here now and has difficulty moving his right leg.  These are both new.  Unsure last normal but was reportedly using them earlier today.  Patient states he is going to leave if he cannot get some water.  Patient informed that he cannot walk right now.  Patient states he will crawl out of he needs to. Past Medical History:  Diagnosis Date  . Alcohol abuse   . Cocaine abuse (HCC)   . GERD (gastroesophageal reflux disease)   . IVDU (intravenous drug user)   . Marijuana abuse   . Tobacco abuse     Patient Active Problem List   Diagnosis Date Noted  . Drug abuse (HCC)   . Endocarditis 09/01/2015  . Sepsis (HCC) 09/01/2015  . Community acquired pneumonia 09/01/2015  . Chest pain 09/01/2015  . Cocaine abuse (HCC)   . Marijuana abuse   . Tobacco abuse   . IVDU (intravenous drug user)   . Alcohol abuse     Past Surgical History:  Procedure Laterality Date  . TYMPANOSTOMY TUBE PLACEMENT         Home Medications    Prior to Admission medications   Not on File    Family History No family history on file.  Social History Social History   Tobacco Use  . Smoking status: Current Some Day Smoker    Packs/day: 1.00    Types: Cigarettes  . Smokeless tobacco: Never Used  Substance Use Topics  . Alcohol use: Yes  . Drug use: Yes    Types: Marijuana, Cocaine, IV     Allergies   Patient has no known  allergies.   Review of Systems Review of Systems  Constitutional: Negative for appetite change and fever.  HENT: Negative for congestion.   Respiratory: Positive for cough. Negative for shortness of breath.   Neurological:       Difficulty hearing.  Right leg weakness     Physical Exam Updated Vital Signs BP 93/66 (BP Location: Right Arm)   Pulse (!) 113   Temp 98.5 F (36.9 C) (Oral)   Resp 15   Ht 5\' 10"  (1.778 m)   Wt 65.8 kg (145 lb)   SpO2 91%   BMI 20.81 kg/m   Physical Exam  Constitutional: He appears well-developed.  HENT:  Head: Normocephalic.  Eyes: EOM are normal.  Pupils are constricted  Neck: Neck supple.  Cardiovascular:  Tachycardia  Pulmonary/Chest:  Equal breath sounds bilaterally  Abdominal: There is no tenderness.  Musculoskeletal:  Hematoma/bruise on left bicep area.  Neurological: He is alert.  Extraocular movements intact.  Pupils constricted.  Face symmetric.  Good grip strength bilaterally.  Moving bilateral upper extremities.  Able to flex bilateral hips but does not have extension at the knee.  Overall somewhat uncooperative for exam and difficult examination due to this.  Skin: Skin is warm. Capillary refill takes less than 2  seconds.     ED Treatments / Results  Labs (all labs ordered are listed, but only abnormal results are displayed) Labs Reviewed  CBC WITH DIFFERENTIAL/PLATELET - Abnormal; Notable for the following components:      Result Value   WBC 13.3 (*)    Neutro Abs 11.1 (*)    Monocytes Absolute 1.4 (*)    All other components within normal limits  RAPID URINE DRUG SCREEN, HOSP PERFORMED - Abnormal; Notable for the following components:   Opiates POSITIVE (*)    Cocaine POSITIVE (*)    Benzodiazepines POSITIVE (*)    Tetrahydrocannabinol POSITIVE (*)    All other components within normal limits  TROPONIN I - Abnormal; Notable for the following components:   Troponin I 0.12 (*)    All other components within normal  limits  COMPREHENSIVE METABOLIC PANEL - Abnormal; Notable for the following components:   Potassium 5.2 (*)    Glucose, Bld 144 (*)    Calcium 8.6 (*)    AST 53 (*)    All other components within normal limits  I-STAT CHEM 8, ED - Abnormal; Notable for the following components:   Glucose, Bld 126 (*)    Calcium, Ion 1.13 (*)    All other components within normal limits  CULTURE, BLOOD (ROUTINE X 2)  CULTURE, BLOOD (ROUTINE X 2)  ETHANOL  BRAIN NATRIURETIC PEPTIDE    EKG  EKG Interpretation  Date/Time:  Sunday March 08 2017 13:29:36 EST Ventricular Rate:  111 PR Interval:    QRS Duration: 94 QT Interval:  324 QTC Calculation: 441 R Axis:   86 Text Interpretation:  Sinus tachycardia ST elev, probable normal early repol pattern Confirmed by Benjiman Core [phone]) on 03/08/2017 1:49:25 PM       Radiology Ct Angio Head W Or Wo Contrast  Result Date: 03/08/2017 CLINICAL DATA:  27 year old male with possible drug overdose. Right lower extremity weakness. EXAM: CT ANGIOGRAPHY HEAD AND NECK TECHNIQUE: Multidetector CT imaging of the head and neck was performed using the standard protocol during bolus administration of intravenous contrast. Multiplanar CT image reconstructions and MIPs were obtained to evaluate the vascular anatomy. Carotid stenosis measurements (when applicable) are obtained utilizing NASCET criteria, using the distal internal carotid diameter as the denominator. CONTRAST:  80mL ISOVUE-370 IOPAMIDOL (ISOVUE-370) INJECTION 76% COMPARISON:  None. FINDINGS: CT HEAD Brain: No midline shift, ventriculomegaly, mass effect, evidence of mass lesion, intracranial hemorrhage or evidence of cortically based acute infarction. Gray-white matter differentiation is within normal limits throughout the brain. Normal cerebral volume. Calvarium and skull base: Negative. Paranasal sinuses: Clear. Orbits: Visualized orbits and scalp soft tissues are within normal limits. CTA NECK Skeleton:  Carious dentition greater on the left side. No acute osseous abnormality identified. Upper chest: /widespread centrilobular abnormal sub solid pulmonary opacity in both lungs, greater on the left. Superimposed partially visible small cavitary lesion in the anterior right upper lobe (series 9, image 1). No superior mediastinal lymphadenopathy. Other neck: Negative.  No neck mass or lymphadenopathy. Aortic arch: The visible central pulmonary artery is patent. Three vessel arch configuration. No arch atherosclerosis or abnormality. Right carotid system: Normal. Left carotid system: Normal. Vertebral arteries: Normal right vertebral artery to the skull base. Normal left vertebral artery to the skull base. The left vertebral is mildly non dominant. Normal proximal subclavian arteries. CTA HEAD Posterior circulation: Mildly dominant distal right vertebral artery. Normal distal vertebral arteries and vertebrobasilar junction. Normal left PICA origin. The right PICA origin is late but normal.  Normal vertebrobasilar junction and basilar artery. AICA, SCA, and right PCA origins are normal. There is a fetal type left PCA origin. The right posterior communicating artery is present. Bilateral PCA branches appear normal. Anterior circulation: Both ICA siphons are patent without atherosclerosis or stenosis. Normal bilateral ophthalmic and posterior communicating artery origins. Patent carotid termini. Dominant right ACA A1 segment, the left is diminutive. Anterior communicating artery and bilateral ACA branches are normal. Left MCA M1 segment, left MCA bifurcation, and left MCA branches are normal. Right MCA M1 segment, right MCA bifurcation, and right MCA branches are normal. Venous sinuses: Patent. The left transverse and sigmoid sinus are dominant. The left IJ is dominant. Anatomic variants: Mildly dominant right vertebral artery. Dominant right and diminutive left ACA A1 segments. Fetal type left PCA origin. Delayed phase: No  abnormal enhancement identified. Fifty Review of the MIP images confirms the above findings IMPRESSION: 1. Normal arterial findings on CTA head and neck and normal CT appearance of the brain. 2. Abnormal lungs with widespread bilateral centrilobular sub solid pulmonary opacity. Consider acute pulmonary edema in this clinical setting, but differential diagnosis would include acute viral/atypical respiratory infection. 3. Small partially visible cavitary lesion in the anterior right upper lobe, probably postinflammatory. Electronically Signed   By: Odessa Fleming M.D.   On: 03/08/2017 14:59   Dg Chest 1 View  Result Date: 03/08/2017 CLINICAL DATA:  Heroin overdose EXAM: PORTABLE CHEST 1 VIEW COMPARISON:  None. FINDINGS: There is bilateral interstitial and alveolar airspace opacities throughout bilateral lungs. There is no focal consolidation. There is no pleural effusion or pneumothorax. The heart and mediastinal contours are unremarkable. The osseous structures are unremarkable. IMPRESSION: Bilateral interstitial and alveolar airspace opacities throughout bilateral lungs. Differential considerations include pulmonary edema versus multilobar pneumonia. Electronically Signed   By: Elige Ko   On: 03/08/2017 14:22   Ct Angio Neck W And/or Wo Contrast  Result Date: 03/08/2017 CLINICAL DATA:  28 year old male with possible drug overdose. Right lower extremity weakness. EXAM: CT ANGIOGRAPHY HEAD AND NECK TECHNIQUE: Multidetector CT imaging of the head and neck was performed using the standard protocol during bolus administration of intravenous contrast. Multiplanar CT image reconstructions and MIPs were obtained to evaluate the vascular anatomy. Carotid stenosis measurements (when applicable) are obtained utilizing NASCET criteria, using the distal internal carotid diameter as the denominator. CONTRAST:  80mL ISOVUE-370 IOPAMIDOL (ISOVUE-370) INJECTION 76% COMPARISON:  None. FINDINGS: CT HEAD Brain: No midline shift,  ventriculomegaly, mass effect, evidence of mass lesion, intracranial hemorrhage or evidence of cortically based acute infarction. Gray-white matter differentiation is within normal limits throughout the brain. Normal cerebral volume. Calvarium and skull base: Negative. Paranasal sinuses: Clear. Orbits: Visualized orbits and scalp soft tissues are within normal limits. CTA NECK Skeleton: Carious dentition greater on the left side. No acute osseous abnormality identified. Upper chest: /widespread centrilobular abnormal sub solid pulmonary opacity in both lungs, greater on the left. Superimposed partially visible small cavitary lesion in the anterior right upper lobe (series 9, image 1). No superior mediastinal lymphadenopathy. Other neck: Negative.  No neck mass or lymphadenopathy. Aortic arch: The visible central pulmonary artery is patent. Three vessel arch configuration. No arch atherosclerosis or abnormality. Right carotid system: Normal. Left carotid system: Normal. Vertebral arteries: Normal right vertebral artery to the skull base. Normal left vertebral artery to the skull base. The left vertebral is mildly non dominant. Normal proximal subclavian arteries. CTA HEAD Posterior circulation: Mildly dominant distal right vertebral artery. Normal distal vertebral arteries and vertebrobasilar junction.  Normal left PICA origin. The right PICA origin is late but normal. Normal vertebrobasilar junction and basilar artery. AICA, SCA, and right PCA origins are normal. There is a fetal type left PCA origin. The right posterior communicating artery is present. Bilateral PCA branches appear normal. Anterior circulation: Both ICA siphons are patent without atherosclerosis or stenosis. Normal bilateral ophthalmic and posterior communicating artery origins. Patent carotid termini. Dominant right ACA A1 segment, the left is diminutive. Anterior communicating artery and bilateral ACA branches are normal. Left MCA M1 segment, left  MCA bifurcation, and left MCA branches are normal. Right MCA M1 segment, right MCA bifurcation, and right MCA branches are normal. Venous sinuses: Patent. The left transverse and sigmoid sinus are dominant. The left IJ is dominant. Anatomic variants: Mildly dominant right vertebral artery. Dominant right and diminutive left ACA A1 segments. Fetal type left PCA origin. Delayed phase: No abnormal enhancement identified. Fifty Review of the MIP images confirms the above findings IMPRESSION: 1. Normal arterial findings on CTA head and neck and normal CT appearance of the brain. 2. Abnormal lungs with widespread bilateral centrilobular sub solid pulmonary opacity. Consider acute pulmonary edema in this clinical setting, but differential diagnosis would include acute viral/atypical respiratory infection. 3. Small partially visible cavitary lesion in the anterior right upper lobe, probably postinflammatory. Electronically Signed   By: Odessa Fleming M.D.   On: 03/08/2017 14:59    Procedures Procedures (including critical care time)  Medications Ordered in ED Medications  piperacillin-tazobactam (ZOSYN) IVPB 3.375 g (not administered)  vancomycin (VANCOCIN) IVPB 1000 mg/200 mL premix (not administered)  iopamidol (ISOVUE-370) 76 % injection 80 mL (80 mLs Intravenous Contrast Given 03/08/17 1352)     Initial Impression / Assessment and Plan / ED Course  I have reviewed the triage vital signs and the nursing notes.  Pertinent labs & imaging results that were available during my care of the patient were reviewed by me and considered in my medical decision making (see chart for details).     Patient presents with overdose.  Cannot hear out of the right ear and cannot extend the right leg at the knee.  Somewhat uncooperative with exam overall however.  Patient states he wants to leave.  States relief unless he gets water.  He was given water.  States there was no way was going to stay but then found that no one was  here to pick him up right now says that he would be admitted to the hospital.  Discussed with neurology upon arrival.  With a history of endocarditis patient would not be a TPA candidate due to the symptoms.  Head CT reassuring.  If this is embolic disease causing weakness still could be along his spine also.  X-ray showed CHF versus pneumonia.  Does have some hypoxia.  Troponin mildly elevated.  Will admit to [facility] to internal medicine for further evaluation and treatment.  I had discussed with Dr. Laurence Slate from neurology.  CRITICAL CARE Performed by: Benjiman Core Total critical care time: 30 minutes Critical care time was exclusive of separately billable procedures and treating other patients. Critical care was necessary to treat or prevent imminent or life-threatening deterioration. Critical care was time spent personally by me on the following activities: development of treatment plan with patient and/or surrogate as well as nursing, discussions with consultants, evaluation of patient's response to treatment, examination of patient, obtaining history from patient or surrogate, ordering and performing treatments and interventions, ordering and review of laboratory studies, ordering and  review of radiographic studies, pulse oximetry and re-evaluation of patient's condition.  Final Clinical Impressions(s) / ED Diagnoses   Final diagnoses:  Polysubstance abuse (HCC)  Accidental drug overdose, initial encounter  Endocarditis, unspecified chronicity, unspecified endocarditis type  Cerebrovascular accident (CVA), unspecified mechanism [facility])    ED Discharge Orders    None       Benjiman Core, MD 03/08/17 1505

## 2017-03-08 NOTE — Progress Notes (Signed)
Pharmacy Antibiotic Note  Kenneth Davis is a 27 y.o. male admitted on 03/08/2017 with pneumonia.  Pharmacy has been consulted for vancomycin and zosyn dosing. Initial doses of vancomycin and zosyn given in the ED  Plan: Continue vancomycin 750 mg IV q8 hours Continue zosyn 3.375 gm IV q8 hours F/u renal function, cultures and clinical course  Height: 5\' 10"  (177.8 cm) Weight: 155 lb 10.3 oz (70.6 kg) IBW/kg (Calculated) : 73  Temp (24hrs), Avg:98.3 F (36.8 C), Min:98.2 F (36.8 C), Max:98.5 F (36.9 C)  Recent Labs  Lab 03/08/17 1320 03/08/17 1339 03/08/17 1349  WBC 13.3*  --   --   CREATININE  --  1.24 1.20    Estimated Creatinine Clearance: 94.8 mL/min (by C-G formula based on SCr of 1.2 mg/dL).    No Known Allergies  Thank you for allowing pharmacy to be a part of this patient's care.  Kenneth Davis, Kenneth Davis 03/08/2017 6:11 PM

## 2017-03-09 ENCOUNTER — Inpatient Hospital Stay (HOSPITAL_COMMUNITY): Payer: Self-pay

## 2017-03-09 ENCOUNTER — Other Ambulatory Visit (HOSPITAL_COMMUNITY): Payer: Self-pay

## 2017-03-09 DIAGNOSIS — F1410 Cocaine abuse, uncomplicated: Secondary | ICD-10-CM

## 2017-03-09 DIAGNOSIS — J690 Pneumonitis due to inhalation of food and vomit: Secondary | ICD-10-CM

## 2017-03-09 DIAGNOSIS — J9601 Acute respiratory failure with hypoxia: Secondary | ICD-10-CM

## 2017-03-09 DIAGNOSIS — F1990 Other psychoactive substance use, unspecified, uncomplicated: Secondary | ICD-10-CM

## 2017-03-09 DIAGNOSIS — F1910 Other psychoactive substance abuse, uncomplicated: Secondary | ICD-10-CM

## 2017-03-09 DIAGNOSIS — K7689 Other specified diseases of liver: Secondary | ICD-10-CM

## 2017-03-09 DIAGNOSIS — R748 Abnormal levels of other serum enzymes: Secondary | ICD-10-CM

## 2017-03-09 DIAGNOSIS — T50901A Poisoning by unspecified drugs, medicaments and biological substances, accidental (unintentional), initial encounter: Principal | ICD-10-CM

## 2017-03-09 DIAGNOSIS — T50901D Poisoning by unspecified drugs, medicaments and biological substances, accidental (unintentional), subsequent encounter: Secondary | ICD-10-CM

## 2017-03-09 DIAGNOSIS — F1210 Cannabis abuse, uncomplicated: Secondary | ICD-10-CM

## 2017-03-09 DIAGNOSIS — G9340 Encephalopathy, unspecified: Secondary | ICD-10-CM

## 2017-03-09 LAB — COMPREHENSIVE METABOLIC PANEL
ALBUMIN: 3.2 g/dL — AB (ref 3.5–5.0)
ALT: 50 U/L (ref 17–63)
AST: 77 U/L — AB (ref 15–41)
Alkaline Phosphatase: 43 U/L (ref 38–126)
Anion gap: 9 (ref 5–15)
BUN: 9 mg/dL (ref 6–20)
CHLORIDE: 100 mmol/L — AB (ref 101–111)
CO2: 24 mmol/L (ref 22–32)
CREATININE: 1 mg/dL (ref 0.61–1.24)
Calcium: 8.5 mg/dL — ABNORMAL LOW (ref 8.9–10.3)
GFR calc Af Amer: >60 mL/min (ref >60)
Glucose, Bld: 111 mg/dL — ABNORMAL HIGH (ref 65–99)
POTASSIUM: 4.2 mmol/L (ref 3.5–5.1)
SODIUM: 133 mmol/L — AB (ref 135–145)
Total Bilirubin: 1.3 mg/dL — ABNORMAL HIGH (ref 0.3–1.2)
Total Protein: 6.1 g/dL — ABNORMAL LOW (ref 6.5–8.1)

## 2017-03-09 LAB — CK TOTAL AND CKMB (NOT AT ARMC)
CK, MB: 51.4 ng/mL — ABNORMAL HIGH (ref 0.5–5.0)
Relative Index: 0.9 (ref 0.0–2.5)
Total CK: 5839 U/L — ABNORMAL HIGH (ref 49–397)

## 2017-03-09 LAB — CBC
HEMATOCRIT: 41.2 % (ref 39.0–52.0)
Hemoglobin: 13.9 g/dL (ref 13.0–17.0)
MCH: 31.3 pg (ref 26.0–34.0)
MCHC: 33.7 g/dL (ref 30.0–36.0)
MCV: 92.8 fL (ref 78.0–100.0)
PLATELETS: 130 10*3/uL — AB (ref 150–400)
RBC: 4.44 MIL/uL (ref 4.22–5.81)
RDW: 13.4 % (ref 11.5–15.5)
WBC: 13.3 10*3/uL — AB (ref 4.0–10.5)

## 2017-03-09 LAB — HEPATITIS PANEL, ACUTE
HEP A IGM: NEGATIVE
HEP B C IGM: NEGATIVE
Hepatitis B Surface Ag: NEGATIVE

## 2017-03-09 LAB — INFLUENZA PANEL BY PCR (TYPE A & B)
INFLAPCR: NEGATIVE
Influenza B By PCR: NEGATIVE

## 2017-03-09 LAB — RESPIRATORY PANEL BY PCR
Adenovirus: NOT DETECTED
BORDETELLA PERTUSSIS-RVPCR: NOT DETECTED
CHLAMYDOPHILA PNEUMONIAE-RVPPCR: NOT DETECTED
CORONAVIRUS 229E-RVPPCR: NOT DETECTED
Coronavirus HKU1: NOT DETECTED
Coronavirus NL63: NOT DETECTED
Coronavirus OC43: NOT DETECTED
INFLUENZA B-RVPPCR: NOT DETECTED
Influenza A: NOT DETECTED
METAPNEUMOVIRUS-RVPPCR: NOT DETECTED
Mycoplasma pneumoniae: NOT DETECTED
PARAINFLUENZA VIRUS 2-RVPPCR: NOT DETECTED
Parainfluenza Virus 1: NOT DETECTED
Parainfluenza Virus 3: NOT DETECTED
Parainfluenza Virus 4: NOT DETECTED
RESPIRATORY SYNCYTIAL VIRUS-RVPPCR: NOT DETECTED
Rhinovirus / Enterovirus: NOT DETECTED

## 2017-03-09 LAB — HIV ANTIBODY (ROUTINE TESTING W REFLEX): HIV SCREEN 4TH GENERATION: NONREACTIVE

## 2017-03-09 LAB — TROPONIN I
TROPONIN I: 0.31 ng/mL — AB (ref <0.03)
Troponin I: 0.48 ng/mL (ref <0.03)

## 2017-03-09 LAB — STREP PNEUMONIAE URINARY ANTIGEN: Strep Pneumo Urinary Antigen: NEGATIVE

## 2017-03-09 MED ORDER — HYDROCODONE-ACETAMINOPHEN 5-325 MG PO TABS
1.0000 | ORAL_TABLET | Freq: Four times a day (QID) | ORAL | Status: DC | PRN
Start: 2017-03-09 — End: 2017-03-09
  Administered 2017-03-09 (×2): 1 via ORAL
  Filled 2017-03-09 (×2): qty 1

## 2017-03-09 MED ORDER — DOXYCYCLINE HYCLATE 100 MG PO CAPS: 100.0000 mg | ORAL_CAPSULE | Freq: Two times a day (BID) | ORAL | 0 refills | Status: AC

## 2017-03-09 MED ORDER — AMOXICILLIN-POT CLAVULANATE 875-125 MG PO TABS: 1.0000 | ORAL_TABLET | Freq: Two times a day (BID) | ORAL | 0 refills | Status: AC

## 2017-03-09 NOTE — BH Assessment (Signed)
[facility] Assessment Progress Note  [facility]. After review of chart, it appeared that pt had an accidental OD on IV heroin or possibly some other substance. It is not noted anywhere that pt was suicidal or trying to kill himself. Clinician called to speak with pt's nurse about the consult and was informed by Maureen RalphsVivian that pt was leaving AMA.   Johny ShockSamantha M. Ladona Ridgelaylor, MS, NCC, LPCA Counselor

## 2017-03-09 NOTE — Progress Notes (Signed)
Patient refused labs and CT. MD notified.

## 2017-03-09 NOTE — Progress Notes (Signed)
CRITICAL VALUE ALERT  Critical Value:  Troponin 0.31  Date & Time Notied:  03/09/2017 0025  Provider Notified:MD Sherryll BurgerShah  Pt refusing lab work currently. Unable to get next troponin.

## 2017-03-09 NOTE — Progress Notes (Signed)
Patient removed monitor and informed us that he was leaving AMA. IV's removed and education given to patient and family. Mom at bedside to take patient home.

## 2017-03-09 NOTE — Progress Notes (Signed)
Patient did eventually agree to wear nasal cannula and he let lab draw his blood. He is still verbally aggressive and rude. Adamant that he is going home today no matter what. Patient was able to stand at bedside with minimum assistance. Right leg still weak, but was able to bear a little weight while standing.  Genelle Balameron D Kyland No, RN

## 2017-03-09 NOTE — Progress Notes (Signed)
CRITICAL VALUE ALERT  Critical Value:  Trop 0.48  Date & Time Notied:  03/09/2017 0256  Provider Notified: MD Sherryll BurgerShah

## 2017-03-09 NOTE — Discharge Summary (Signed)
Physician Discharge Summary  Kenneth Davis JJO:[id number] DOB: January 05, 1991 DOA: 03/08/2017  PCP: Patient, No Pcp Per  Admit date: 03/08/2017 Discharge date: 03/09/2017  Admitted From: Home Disposition:   AGAINST MEDICAL ADVICE   Discharge Condition: AGAINST MEDICAL ADVICE CODE STATUS: AGAINST MEDICAL ADVICE    Brief/Interim Summary: 27 year old male with polysubstance abuse, septic emboli to the lungs presenting with an episode of unresponsiveness.  The patient's history is inconsistent.  He does not recall the events surrounding his drug overdose.  Apparently, a bystander called EMS when the patient was found unresponsive.  The patient stated that he bought some heroin from his dealer and injected as his usual routine.  For some reason, he did not have the same type of euphoria.  As result, he somehow thought that he was injecting horse tranquilizer.  Upon further questioning, the patient was unable to clarify or give more details regarding the events surrounding his intravenous drug use on 03/08/2017.  He was not able to tell me why he felt like he was injecting horse tranquilizer.  He denied using any other injectable drugs on 03/08/2017, but he stated that he has been smoking crack cocaine and using alprazolam.  Upon EMS arrival, the patient had oxygen saturation of 83%.  Apparently, a male by standard had told EMS the patient used horse tranquilizer.  The patient was placed on nonrebreather with improvement of his oxygen saturation.  The patient denies any recent fevers, chills, headache, chest pain, shortness breath, hemoptysis, nausea, vomiting, diarrhea, abdominal pain.  On the morning of 03/09/2017, the patient was alert and oriented times 4.  He denied any homicidal or suicidal ideation.  He denied intentional overdose.  The patient stated that he was "just trying to have fun".  Upon presentation, the patient was hypoxemic and hypotensive with blood pressure of 82/65.  CT angiogram of the head and  neck was negative for any hemodynamically significant lesions.  His CT of the brain was negative.  There was incidental finding of widespread bilateral centrilobular pulmonary opacities left greater than right with small cavitary lesion on the right upper lobe. Once the patient was more lucid, the patient wanted to leave AGAINST MEDICAL ADVICE.  He denied any suicidal or homicidal ideation.  I discussed the risks, benefits, and alternatives including but not limited to respiratory failure, worsening infection, sepsis, and death.  He expressed understanding and wanted to leave AGAINST MEDICAL ADVICE.  Prescriptions were provided for doxycycline and Augmentin times 10 days.  There was sent to his pharmacy of record.   Discharge Diagnoses:  Acute toxic encephalopathy -Secondary to illicit drug overdose--unclear what was injected -pt evasive upon questioning -improved, back to baseline -MRI brain--neg  Accidental Drug Overdose -unclear what was injected -pt evasive upon questioning -improved, back to baseline -Patient is now back to baseline and hemodynamically stable  Right lower extremity weakness/Rhabdomyolysis -Concerned about septic emboli to the brain -MRI brain--neg -Check CPK--5839 -IVF  Cavitary lung lesions/aspiration pneumonitis -Continue vancomycin and Zosyn pending culture data -Discontinue azithromycin -Follow blood cultures--NEG <24 hours -CT chest--pt refused -Suspect the patient has septic emboli to the lung -Echocardiogram--left AMA before could be performed -Influenza negative -respiratory virus panel--neg  Elevated troponin -No chest pain presently -Secondary to demand ischemia in the setting of drug overdose -Echocardiogram--left AMA before could be performed  Transaminasemia -Hepatitis B surface antigen--neg -Hepatitis C antibody--positive -HIV antibody--neg -RUQ US neg  Polysubstance abuse -Including heroin, tobacco, cocaine     Discharge  Instructions   Allergies as of  03/09/2017   No Known Allergies     Medication List    TAKE these medications   amoxicillin-clavulanate 875-125 MG tablet Commonly known as:  AUGMENTIN Take 1 tablet by mouth 2 (two) times daily for 10 days.   doxycycline 100 MG capsule Commonly known as:  VIBRAMYCIN Take 1 capsule (100 mg total) by mouth 2 (two) times daily.       No Known Allergies  Consultations:  none   Procedures/Studies: Ct Angio Head W Or Wo Contrast  Result Date: 03/08/2017 CLINICAL DATA:  27 year old male with possible drug overdose. Right lower extremity weakness. EXAM: CT ANGIOGRAPHY HEAD AND NECK TECHNIQUE: Multidetector CT imaging of the head and neck was performed using the standard protocol during bolus administration of intravenous contrast. Multiplanar CT image reconstructions and MIPs were obtained to evaluate the vascular anatomy. Carotid stenosis measurements (when applicable) are obtained utilizing NASCET criteria, using the distal internal carotid diameter as the denominator. CONTRAST:  80mL ISOVUE-370 IOPAMIDOL (ISOVUE-370) INJECTION 76% COMPARISON:  None. FINDINGS: CT HEAD Brain: No midline shift, ventriculomegaly, mass effect, evidence of mass lesion, intracranial hemorrhage or evidence of cortically based acute infarction. Gray-white matter differentiation is within normal limits throughout the brain. Normal cerebral volume. Calvarium and skull base: Negative. Paranasal sinuses: Clear. Orbits: Visualized orbits and scalp soft tissues are within normal limits. CTA NECK Skeleton: Carious dentition greater on the left side. No acute osseous abnormality identified. Upper chest: /widespread centrilobular abnormal sub solid pulmonary opacity in both lungs, greater on the left. Superimposed partially visible small cavitary lesion in the anterior right upper lobe (series 9, image 1). No superior mediastinal lymphadenopathy. Other neck: Negative.  No neck mass or  lymphadenopathy. Aortic arch: The visible central pulmonary artery is patent. Three vessel arch configuration. No arch atherosclerosis or abnormality. Right carotid system: Normal. Left carotid system: Normal. Vertebral arteries: Normal right vertebral artery to the skull base. Normal left vertebral artery to the skull base. The left vertebral is mildly non dominant. Normal proximal subclavian arteries. CTA HEAD Posterior circulation: Mildly dominant distal right vertebral artery. Normal distal vertebral arteries and vertebrobasilar junction. Normal left PICA origin. The right PICA origin is late but normal. Normal vertebrobasilar junction and basilar artery. AICA, SCA, and right PCA origins are normal. There is a fetal type left PCA origin. The right posterior communicating artery is present. Bilateral PCA branches appear normal. Anterior circulation: Both ICA siphons are patent without atherosclerosis or stenosis. Normal bilateral ophthalmic and posterior communicating artery origins. Patent carotid termini. Dominant right ACA A1 segment, the left is diminutive. Anterior communicating artery and bilateral ACA branches are normal. Left MCA M1 segment, left MCA bifurcation, and left MCA branches are normal. Right MCA M1 segment, right MCA bifurcation, and right MCA branches are normal. Venous sinuses: Patent. The left transverse and sigmoid sinus are dominant. The left IJ is dominant. Anatomic variants: Mildly dominant right vertebral artery. Dominant right and diminutive left ACA A1 segments. Fetal type left PCA origin. Delayed phase: No abnormal enhancement identified. Fifty Review of the MIP images confirms the above findings IMPRESSION: 1. Normal arterial findings on CTA head and neck and normal CT appearance of the brain. 2. Abnormal lungs with widespread bilateral centrilobular sub solid pulmonary opacity. Consider acute pulmonary edema in this clinical setting, but differential diagnosis would include acute  viral/atypical respiratory infection. 3. Small partially visible cavitary lesion in the anterior right upper lobe, probably postinflammatory. Electronically Signed   By: Odessa Fleming M.D.   On: 03/08/2017 14:59  Dg Chest 1 View  Result Date: 03/08/2017 CLINICAL DATA:  Heroin overdose EXAM: PORTABLE CHEST 1 VIEW COMPARISON:  None. FINDINGS: There is bilateral interstitial and alveolar airspace opacities throughout bilateral lungs. There is no focal consolidation. There is no pleural effusion or pneumothorax. The heart and mediastinal contours are unremarkable. The osseous structures are unremarkable. IMPRESSION: Bilateral interstitial and alveolar airspace opacities throughout bilateral lungs. Differential considerations include pulmonary edema versus multilobar pneumonia. Electronically Signed   By: Elige Ko   On: 03/08/2017 14:22   Ct Angio Neck W And/or Wo Contrast  Result Date: 03/08/2017 CLINICAL DATA:  27 year old male with possible drug overdose. Right lower extremity weakness. EXAM: CT ANGIOGRAPHY HEAD AND NECK TECHNIQUE: Multidetector CT imaging of the head and neck was performed using the standard protocol during bolus administration of intravenous contrast. Multiplanar CT image reconstructions and MIPs were obtained to evaluate the vascular anatomy. Carotid stenosis measurements (when applicable) are obtained utilizing NASCET criteria, using the distal internal carotid diameter as the denominator. CONTRAST:  80mL ISOVUE-370 IOPAMIDOL (ISOVUE-370) INJECTION 76% COMPARISON:  None. FINDINGS: CT HEAD Brain: No midline shift, ventriculomegaly, mass effect, evidence of mass lesion, intracranial hemorrhage or evidence of cortically based acute infarction. Gray-white matter differentiation is within normal limits throughout the brain. Normal cerebral volume. Calvarium and skull base: Negative. Paranasal sinuses: Clear. Orbits: Visualized orbits and scalp soft tissues are within normal limits. CTA NECK  Skeleton: Carious dentition greater on the left side. No acute osseous abnormality identified. Upper chest: /widespread centrilobular abnormal sub solid pulmonary opacity in both lungs, greater on the left. Superimposed partially visible small cavitary lesion in the anterior right upper lobe (series 9, image 1). No superior mediastinal lymphadenopathy. Other neck: Negative.  No neck mass or lymphadenopathy. Aortic arch: The visible central pulmonary artery is patent. Three vessel arch configuration. No arch atherosclerosis or abnormality. Right carotid system: Normal. Left carotid system: Normal. Vertebral arteries: Normal right vertebral artery to the skull base. Normal left vertebral artery to the skull base. The left vertebral is mildly non dominant. Normal proximal subclavian arteries. CTA HEAD Posterior circulation: Mildly dominant distal right vertebral artery. Normal distal vertebral arteries and vertebrobasilar junction. Normal left PICA origin. The right PICA origin is late but normal. Normal vertebrobasilar junction and basilar artery. AICA, SCA, and right PCA origins are normal. There is a fetal type left PCA origin. The right posterior communicating artery is present. Bilateral PCA branches appear normal. Anterior circulation: Both ICA siphons are patent without atherosclerosis or stenosis. Normal bilateral ophthalmic and posterior communicating artery origins. Patent carotid termini. Dominant right ACA A1 segment, the left is diminutive. Anterior communicating artery and bilateral ACA branches are normal. Left MCA M1 segment, left MCA bifurcation, and left MCA branches are normal. Right MCA M1 segment, right MCA bifurcation, and right MCA branches are normal. Venous sinuses: Patent. The left transverse and sigmoid sinus are dominant. The left IJ is dominant. Anatomic variants: Mildly dominant right vertebral artery. Dominant right and diminutive left ACA A1 segments. Fetal type left PCA origin. Delayed  phase: No abnormal enhancement identified. Fifty Review of the MIP images confirms the above findings IMPRESSION: 1. Normal arterial findings on CTA head and neck and normal CT appearance of the brain. 2. Abnormal lungs with widespread bilateral centrilobular sub solid pulmonary opacity. Consider acute pulmonary edema in this clinical setting, but differential diagnosis would include acute viral/atypical respiratory infection. 3. Small partially visible cavitary lesion in the anterior right upper lobe, probably postinflammatory. Electronically Signed  By: Odessa Fleming M.D.   On: 03/08/2017 14:59   Mr Brain Wo Contrast  Result Date: 03/09/2017 CLINICAL DATA:  Initial evaluation for possible drug overdose. EXAM: MRI HEAD WITHOUT CONTRAST TECHNIQUE: Multiplanar, multiecho pulse sequences of the brain and surrounding structures were obtained without intravenous contrast. COMPARISON:  Prior CTA from 03/08/2017. FINDINGS: Brain: Study mildly degraded by motion artifact. Cerebral volume within normal limits. No focal parenchymal signal abnormality. No abnormal foci of restricted diffusion to suggest acute or subacute ischemia. Gray-white matter differentiation maintained. No encephalomalacia to suggest chronic infarction. No evidence for acute or chronic intracranial hemorrhage. No mass lesion, midline shift or mass effect. No hydrocephalus. No extra-axial fluid collection. Midline structures intact and normal. Vascular: Major intracranial vascular flow voids maintained. Skull and upper cervical spine: Craniocervical junction within normal limits. Upper cervical spine grossly unremarkable, although limited in assessment on this motion degraded exam. Bone marrow signal intensity within normal limits. No scalp soft tissue abnormality. Sinuses/Orbits: Globes and orbital soft tissues grossly within normal limits. Mild maxillary and ethmoidal sinus mucosal thickening. Paranasal sinuses are otherwise clear. Trace right pleural  effusion noted. Other: None. IMPRESSION: Normal brain MRI.  No acute intracranial abnormality identified. Electronically Signed   By: Rise Mu M.D.   On: 03/09/2017 13:04   US Abdomen Limited Ruq  Result Date: 03/09/2017 CLINICAL DATA:  Abnormal liver function tests. EXAM: ULTRASOUND ABDOMEN LIMITED RIGHT UPPER QUADRANT COMPARISON:  None. FINDINGS: Gallbladder: No gallstones or wall thickening visualized. No sonographic Murphy sign noted by sonographer. Common bile duct: Diameter: 3 mm Liver: No focal lesion identified. Within normal limits in parenchymal echogenicity. Portal vein is patent on color Doppler imaging with normal direction of blood flow towards the liver. IMPRESSION: Normal right upper quadrant ultrasound. Electronically Signed   By: Bary Richard M.D.   On: 03/09/2017 08:45         Discharge Exam: Vitals:   03/09/17 1100 03/09/17 1211  BP: 102/67   Pulse: 92   Resp: 13   Temp:  98.1 F (36.7 C)  SpO2:     Vitals:   03/09/17 0830 03/09/17 1000 03/09/17 1100 03/09/17 1211  BP:  97/71 102/67   Pulse:  94 92   Resp:   13   Temp: 97.9 F (36.6 C)   98.1 F (36.7 C)  TempSrc: Oral   Oral  SpO2:      Weight:      Height:        General: Pt is alert, awake, not in acute distress Cardiovascular: RRR, S1/S2 +, no rubs, no gallops Respiratory: CTA bilaterally, no wheezing, no rhonchi Abdominal: Soft, NT, ND, bowel sounds + Extremities: no edema, no cyanosis   The results of significant diagnostics from this hospitalization (including imaging, microbiology, ancillary and laboratory) are listed below for reference.    Significant Diagnostic Studies: Ct Angio Head W Or Wo Contrast  Result Date: 03/08/2017 CLINICAL DATA:  27 year old male with possible drug overdose. Right lower extremity weakness. EXAM: CT ANGIOGRAPHY HEAD AND NECK TECHNIQUE: Multidetector CT imaging of the head and neck was performed using the standard protocol during bolus administration  of intravenous contrast. Multiplanar CT image reconstructions and MIPs were obtained to evaluate the vascular anatomy. Carotid stenosis measurements (when applicable) are obtained utilizing NASCET criteria, using the distal internal carotid diameter as the denominator. CONTRAST:  80mL ISOVUE-370 IOPAMIDOL (ISOVUE-370) INJECTION 76% COMPARISON:  None. FINDINGS: CT HEAD Brain: No midline shift, ventriculomegaly, mass effect, evidence of mass lesion, intracranial hemorrhage  or evidence of cortically based acute infarction. Gray-white matter differentiation is within normal limits throughout the brain. Normal cerebral volume. Calvarium and skull base: Negative. Paranasal sinuses: Clear. Orbits: Visualized orbits and scalp soft tissues are within normal limits. CTA NECK Skeleton: Carious dentition greater on the left side. No acute osseous abnormality identified. Upper chest: /widespread centrilobular abnormal sub solid pulmonary opacity in both lungs, greater on the left. Superimposed partially visible small cavitary lesion in the anterior right upper lobe (series 9, image 1). No superior mediastinal lymphadenopathy. Other neck: Negative.  No neck mass or lymphadenopathy. Aortic arch: The visible central pulmonary artery is patent. Three vessel arch configuration. No arch atherosclerosis or abnormality. Right carotid system: Normal. Left carotid system: Normal. Vertebral arteries: Normal right vertebral artery to the skull base. Normal left vertebral artery to the skull base. The left vertebral is mildly non dominant. Normal proximal subclavian arteries. CTA HEAD Posterior circulation: Mildly dominant distal right vertebral artery. Normal distal vertebral arteries and vertebrobasilar junction. Normal left PICA origin. The right PICA origin is late but normal. Normal vertebrobasilar junction and basilar artery. AICA, SCA, and right PCA origins are normal. There is a fetal type left PCA origin. The right posterior  communicating artery is present. Bilateral PCA branches appear normal. Anterior circulation: Both ICA siphons are patent without atherosclerosis or stenosis. Normal bilateral ophthalmic and posterior communicating artery origins. Patent carotid termini. Dominant right ACA A1 segment, the left is diminutive. Anterior communicating artery and bilateral ACA branches are normal. Left MCA M1 segment, left MCA bifurcation, and left MCA branches are normal. Right MCA M1 segment, right MCA bifurcation, and right MCA branches are normal. Venous sinuses: Patent. The left transverse and sigmoid sinus are dominant. The left IJ is dominant. Anatomic variants: Mildly dominant right vertebral artery. Dominant right and diminutive left ACA A1 segments. Fetal type left PCA origin. Delayed phase: No abnormal enhancement identified. Fifty Review of the MIP images confirms the above findings IMPRESSION: 1. Normal arterial findings on CTA head and neck and normal CT appearance of the brain. 2. Abnormal lungs with widespread bilateral centrilobular sub solid pulmonary opacity. Consider acute pulmonary edema in this clinical setting, but differential diagnosis would include acute viral/atypical respiratory infection. 3. Small partially visible cavitary lesion in the anterior right upper lobe, probably postinflammatory. Electronically Signed   By: Odessa Fleming M.D.   On: 03/08/2017 14:59   Dg Chest 1 View  Result Date: 03/08/2017 CLINICAL DATA:  Heroin overdose EXAM: PORTABLE CHEST 1 VIEW COMPARISON:  None. FINDINGS: There is bilateral interstitial and alveolar airspace opacities throughout bilateral lungs. There is no focal consolidation. There is no pleural effusion or pneumothorax. The heart and mediastinal contours are unremarkable. The osseous structures are unremarkable. IMPRESSION: Bilateral interstitial and alveolar airspace opacities throughout bilateral lungs. Differential considerations include pulmonary edema versus multilobar  pneumonia. Electronically Signed   By: Elige Ko   On: 03/08/2017 14:22   Ct Angio Neck W And/or Wo Contrast  Result Date: 03/08/2017 CLINICAL DATA:  27 year old male with possible drug overdose. Right lower extremity weakness. EXAM: CT ANGIOGRAPHY HEAD AND NECK TECHNIQUE: Multidetector CT imaging of the head and neck was performed using the standard protocol during bolus administration of intravenous contrast. Multiplanar CT image reconstructions and MIPs were obtained to evaluate the vascular anatomy. Carotid stenosis measurements (when applicable) are obtained utilizing NASCET criteria, using the distal internal carotid diameter as the denominator. CONTRAST:  80mL ISOVUE-370 IOPAMIDOL (ISOVUE-370) INJECTION 76% COMPARISON:  None. FINDINGS: CT HEAD Brain:  No midline shift, ventriculomegaly, mass effect, evidence of mass lesion, intracranial hemorrhage or evidence of cortically based acute infarction. Gray-white matter differentiation is within normal limits throughout the brain. Normal cerebral volume. Calvarium and skull base: Negative. Paranasal sinuses: Clear. Orbits: Visualized orbits and scalp soft tissues are within normal limits. CTA NECK Skeleton: Carious dentition greater on the left side. No acute osseous abnormality identified. Upper chest: /widespread centrilobular abnormal sub solid pulmonary opacity in both lungs, greater on the left. Superimposed partially visible small cavitary lesion in the anterior right upper lobe (series 9, image 1). No superior mediastinal lymphadenopathy. Other neck: Negative.  No neck mass or lymphadenopathy. Aortic arch: The visible central pulmonary artery is patent. Three vessel arch configuration. No arch atherosclerosis or abnormality. Right carotid system: Normal. Left carotid system: Normal. Vertebral arteries: Normal right vertebral artery to the skull base. Normal left vertebral artery to the skull base. The left vertebral is mildly non dominant. Normal  proximal subclavian arteries. CTA HEAD Posterior circulation: Mildly dominant distal right vertebral artery. Normal distal vertebral arteries and vertebrobasilar junction. Normal left PICA origin. The right PICA origin is late but normal. Normal vertebrobasilar junction and basilar artery. AICA, SCA, and right PCA origins are normal. There is a fetal type left PCA origin. The right posterior communicating artery is present. Bilateral PCA branches appear normal. Anterior circulation: Both ICA siphons are patent without atherosclerosis or stenosis. Normal bilateral ophthalmic and posterior communicating artery origins. Patent carotid termini. Dominant right ACA A1 segment, the left is diminutive. Anterior communicating artery and bilateral ACA branches are normal. Left MCA M1 segment, left MCA bifurcation, and left MCA branches are normal. Right MCA M1 segment, right MCA bifurcation, and right MCA branches are normal. Venous sinuses: Patent. The left transverse and sigmoid sinus are dominant. The left IJ is dominant. Anatomic variants: Mildly dominant right vertebral artery. Dominant right and diminutive left ACA A1 segments. Fetal type left PCA origin. Delayed phase: No abnormal enhancement identified. Fifty Review of the MIP images confirms the above findings IMPRESSION: 1. Normal arterial findings on CTA head and neck and normal CT appearance of the brain. 2. Abnormal lungs with widespread bilateral centrilobular sub solid pulmonary opacity. Consider acute pulmonary edema in this clinical setting, but differential diagnosis would include acute viral/atypical respiratory infection. 3. Small partially visible cavitary lesion in the anterior right upper lobe, probably postinflammatory. Electronically Signed   By: Odessa Fleming M.D.   On: 03/08/2017 14:59   Mr Brain Wo Contrast  Result Date: 03/09/2017 CLINICAL DATA:  Initial evaluation for possible drug overdose. EXAM: MRI HEAD WITHOUT CONTRAST TECHNIQUE: Multiplanar,  multiecho pulse sequences of the brain and surrounding structures were obtained without intravenous contrast. COMPARISON:  Prior CTA from 03/08/2017. FINDINGS: Brain: Study mildly degraded by motion artifact. Cerebral volume within normal limits. No focal parenchymal signal abnormality. No abnormal foci of restricted diffusion to suggest acute or subacute ischemia. Gray-white matter differentiation maintained. No encephalomalacia to suggest chronic infarction. No evidence for acute or chronic intracranial hemorrhage. No mass lesion, midline shift or mass effect. No hydrocephalus. No extra-axial fluid collection. Midline structures intact and normal. Vascular: Major intracranial vascular flow voids maintained. Skull and upper cervical spine: Craniocervical junction within normal limits. Upper cervical spine grossly unremarkable, although limited in assessment on this motion degraded exam. Bone marrow signal intensity within normal limits. No scalp soft tissue abnormality. Sinuses/Orbits: Globes and orbital soft tissues grossly within normal limits. Mild maxillary and ethmoidal sinus mucosal thickening. Paranasal sinuses are otherwise clear.  Trace right pleural effusion noted. Other: None. IMPRESSION: Normal brain MRI.  No acute intracranial abnormality identified. Electronically Signed   By: Rise Mu M.D.   On: 03/09/2017 13:04   US Abdomen Limited Ruq  Result Date: 03/09/2017 CLINICAL DATA:  Abnormal liver function tests. EXAM: ULTRASOUND ABDOMEN LIMITED RIGHT UPPER QUADRANT COMPARISON:  None. FINDINGS: Gallbladder: No gallstones or wall thickening visualized. No sonographic Murphy sign noted by sonographer. Common bile duct: Diameter: 3 mm Liver: No focal lesion identified. Within normal limits in parenchymal echogenicity. Portal vein is patent on color Doppler imaging with normal direction of blood flow towards the liver. IMPRESSION: Normal right upper quadrant ultrasound. Electronically Signed    By: Bary Richard M.D.   On: 03/09/2017 08:45     Microbiology: Recent Results (from the past 240 hour(s))  Culture, blood (routine x 2)     Status: None (Preliminary result)   Collection Time: 03/08/17  1:36 PM  Result Value Ref Range Status   Specimen Description BLOOD LEFT HAND  Final   Special Requests   Final    BOTTLES DRAWN AEROBIC AND ANAEROBIC Blood Culture results may not be optimal due to an inadequate volume of blood received in culture bottles   Culture   Final    NO GROWTH < 24 HOURS Performed at [facility], [address], [place], [place] [address]    Report Status PENDING  Incomplete  Culture, blood (routine x 2)     Status: None (Preliminary result)   Collection Time: 03/08/17  1:36 PM  Result Value Ref Range Status   Specimen Description BLOOD LEFT HAND  Final   Special Requests   Final    BOTTLES DRAWN AEROBIC AND ANAEROBIC Blood Culture adequate volume   Culture   Final    NO GROWTH < 24 HOURS Performed at [facility], [address], [place], [place] [address]    Report Status PENDING  Incomplete  MRSA PCR Screening     Status: None   Collection Time: 03/08/17  5:30 PM  Result Value Ref Range Status   MRSA by PCR NEGATIVE NEGATIVE Final    Comment:        The GeneXpert MRSA Assay (FDA approved for NASAL specimens only), is one component of a comprehensive MRSA colonization surveillance program. It is not intended to diagnose MRSA infection nor to guide or monitor treatment for MRSA infections. Performed at [facility], [address] [place], [place] [address]   Respiratory Panel by PCR     Status: None   Collection Time: 03/08/17  9:27 PM  Result Value Ref Range Status   Adenovirus NOT DETECTED NOT DETECTED Final   Coronavirus 229E NOT DETECTED NOT DETECTED Final   Coronavirus HKU1 NOT DETECTED NOT DETECTED Final   Coronavirus NL63 NOT DETECTED NOT DETECTED Final   Coronavirus OC43 NOT DETECTED NOT DETECTED Final   Metapneumovirus NOT  DETECTED NOT DETECTED Final   Rhinovirus / Enterovirus NOT DETECTED NOT DETECTED Final   Influenza A NOT DETECTED NOT DETECTED Final   Influenza B NOT DETECTED NOT DETECTED Final   Parainfluenza Virus 1 NOT DETECTED NOT DETECTED Final   Parainfluenza Virus 2 NOT DETECTED NOT DETECTED Final   Parainfluenza Virus 3 NOT DETECTED NOT DETECTED Final   Parainfluenza Virus 4 NOT DETECTED NOT DETECTED Final   Respiratory Syncytial Virus NOT DETECTED NOT DETECTED Final   Bordetella pertussis NOT DETECTED NOT DETECTED Final   Chlamydophila pneumoniae NOT DETECTED NOT DETECTED Final  Mycoplasma pneumoniae NOT DETECTED NOT DETECTED Final     Labs: Basic Metabolic Panel: Recent Labs  Lab 03/08/17 1339 03/08/17 1349 03/09/17 0158  NA 135 137 133*  K 5.2* 5.1 4.2  CL 102 102 100*  CO2 23  --  24  GLUCOSE 144* 126* 111*  BUN 12 12 9   CREATININE 1.24 1.20 1.00  CALCIUM 8.6*  --  8.5*   Liver Function Tests: Recent Labs  Lab 03/08/17 1339 03/09/17 0158  AST 53* 77*  ALT 49 50  ALKPHOS 63 43  BILITOT 0.6 1.3*  PROT 7.2 6.1*  ALBUMIN 3.8 3.2*   No results for input(s): LIPASE, AMYLASE in the last 168 hours. No results for input(s): AMMONIA in the last 168 hours. CBC: Recent Labs  Lab 03/08/17 1320 03/08/17 1349 03/09/17 0158  WBC 13.3*  --  13.3*  NEUTROABS 11.1*  --   --   HGB 16.9 16.3 13.9  HCT 50.2 48.0 41.2  MCV 92.3  --  92.8  PLT 162  --  130*   Cardiac Enzymes: Recent Labs  Lab 03/08/17 1326 03/08/17 1734 03/09/17 0158  CKTOTAL  --  5,839*  --   CKMB  --  51.4*  --   TROPONINI 0.12* 0.31* 0.48*   BNP: Invalid input(s): POCBNP CBG: No results for input(s): GLUCAP in the last 168 hours.  Time coordinating discharge:  Greater than 30 minutes  Signed:  Catarina Hartshorn, DO [facility] Pager: [phone] 03/09/2017, 6:24 PM

## 2017-03-09 NOTE — Consult Note (Addendum)
Consult requested by: [facility], Dr. Arbutus Leasat Consult requested for: Abnormal chest x-ray  HPI: This is a 27 year old who was brought to the emergency room for a because of a drug overdose.  He has a history of intravenous drug use including heroin and cocaine and who had a drug overdose at home.  He said he was trying to get high and took a number of drugs.  He became hypotensive and hypoxic.  He originally reported to Dr. Selena BattenKim who did his history and physical that he had had a cough for about 4 weeks with some yellow sputum and had nausea and vomiting on the day prior to admission but he is verbally abusive and really not willing to give any history now except to state that he is not suicidal.  He denies cough.  He says he does smoke.  Unsure about weight loss no history of lung disease.  History likely unreliable considering his belligerence.  Past Medical History:  Diagnosis Date  . Alcohol abuse   . Cocaine abuse (HCC)   . GERD (gastroesophageal reflux disease)   . IVDU (intravenous drug user)   . Marijuana abuse   . Tobacco abuse      Family History  Family history unknown: Yes  No known family history of lung disease  Social History   Socioeconomic History  . Marital status: Single    Spouse name: None  . Number of children: None  . Years of education: None  . Highest education level: None  Social Needs  . Financial resource strain: None  . Food insecurity - worry: None  . Food insecurity - inability: None  . Transportation needs - medical: None  . Transportation needs - non-medical: None  Occupational History  . None  Tobacco Use  . Smoking status: Current Some Day Smoker    Packs/day: 1.00    Types: Cigarettes  . Smokeless tobacco: Never Used  Substance and Sexual Activity  . Alcohol use: Yes  . Drug use: Yes    Types: Marijuana, Cocaine, IV  . Sexual activity: Yes    Birth control/protection: Condom  Other Topics Concern  . None  Social History Narrative   . None     ROS: Not obtainable    Objective: Vital signs in last 24 hours: Temp:  [98 F (36.7 C)-98.7 F (37.1 C)] 98 F (36.7 C) (03/04 0400) Pulse Rate:  [84-114] 84 (03/04 0600) Resp:  [12-29] 20 (03/04 0600) BP: (82-125)/(55-87) 110/61 (03/04 0600) SpO2:  [60 %-100 %] 99 % (03/04 0600) FiO2 (%):  [40 %] 40 % (03/03 1550) Weight:  [65.8 kg (145 lb)-70.6 kg (155 lb 10.3 oz)] 70.4 kg (155 lb 3.3 oz) (03/04 0500) Weight change:  Last BM Date: 03/07/17  Intake/Output from previous day: 03/03 0701 - 03/04 0700 In: 2672.5 [P.O.:1030; I.V.:1042.5; IV Piggyback:600] Out: 3275 [Urine:3275]  PHYSICAL EXAM Constitutional: Thin male in no acute distress ears nose mouth and throat: Mucous membranes are moist.  Hearing is grossly normal.  Eyes: Pupils react cardiovascular: His heart is regular with normal heart sounds.  Respiratory: He has some rhonchi and wheezing bilaterally.  Gastrointestinal: His abdomen is soft with no masses.  Extremities: He still seems to have some weakness of his right leg.  Neurological: Weakness of the right leg no other focal abnormalities.  Psychiatric: As mentioned he is belligerent and verbally abusive  Lab Results: Basic Metabolic Panel: Recent Labs    03/08/17 1339 03/08/17 1349 03/09/17 0158  NA 135 137  133*  K 5.2* 5.1 4.2  CL 102 102 100*  CO2 23  --  24  GLUCOSE 144* 126* 111*  BUN 12 12 9   CREATININE 1.24 1.20 1.00  CALCIUM 8.6*  --  8.5*   Liver Function Tests: Recent Labs    03/08/17 1339 03/09/17 0158  AST 53* 77*  ALT 49 50  ALKPHOS 63 43  BILITOT 0.6 1.3*  PROT 7.2 6.1*  ALBUMIN 3.8 3.2*   No results for input(s): LIPASE, AMYLASE in the last 72 hours. No results for input(s): AMMONIA in the last 72 hours. CBC: Recent Labs    03/08/17 1320 03/08/17 1349 03/09/17 0158  WBC 13.3*  --  13.3*  NEUTROABS 11.1*  --   --   HGB 16.9 16.3 13.9  HCT 50.2 48.0 41.2  MCV 92.3  --  92.8  PLT 162  --  130*   Cardiac  Enzymes: Recent Labs    03/08/17 1326 03/08/17 1734 03/09/17 0158  TROPONINI 0.12* 0.31* 0.48*   BNP: No results for input(s): PROBNP in the last 72 hours. D-Dimer: No results for input(s): DDIMER in the last 72 hours. CBG: No results for input(s): GLUCAP in the last 72 hours. Hemoglobin A1C: No results for input(s): HGBA1C in the last 72 hours. Fasting Lipid Panel: No results for input(s): CHOL, HDL, LDLCALC, TRIG, CHOLHDL, LDLDIRECT in the last 72 hours. Thyroid Function Tests: Recent Labs    03/08/17 1734  TSH 0.166*   Anemia Panel: No results for input(s): VITAMINB12, FOLATE, FERRITIN, TIBC, IRON, RETICCTPCT in the last 72 hours. Coagulation: No results for input(s): LABPROT, INR in the last 72 hours. Urine Drug Screen: Drugs of Abuse     Component Value Date/Time   LABOPIA POSITIVE (A) 03/08/2017 1330   COCAINSCRNUR POSITIVE (A) 03/08/2017 1330   LABBENZ POSITIVE (A) 03/08/2017 1330   AMPHETMU NONE DETECTED 03/08/2017 1330   THCU POSITIVE (A) 03/08/2017 1330   LABBARB NONE DETECTED 03/08/2017 1330    Alcohol Level: Recent Labs    03/08/17 1339  ETH <10   Urinalysis: No results for input(s): COLORURINE, LABSPEC, PHURINE, GLUCOSEU, HGBUR, BILIRUBINUR, KETONESUR, PROTEINUR, UROBILINOGEN, NITRITE, LEUKOCYTESUR in the last 72 hours.  Invalid input(s): APPERANCEUR Misc. Labs:   ABGS: Recent Labs    03/08/17 1349  TCO2 24     MICROBIOLOGY: Recent Results (from the past 240 hour(s))  Culture, blood (routine x 2)     Status: None (Preliminary result)   Collection Time: 03/08/17  1:36 PM  Result Value Ref Range Status   Specimen Description BLOOD  Final   Special Requests NONE  Final   Culture   Final    NO GROWTH < 24 HOURS Performed at [facility], [address] [place], [place] [address]    Report Status PENDING  Incomplete  Culture, blood (routine x 2)     Status: None (Preliminary result)   Collection Time: 03/08/17  1:36 PM  Result Value  Ref Range Status   Specimen Description BLOOD  Final   Special Requests NONE  Final   Culture   Final    NO GROWTH < 24 HOURS Performed at [facility], [address], [place], [place] [address]    Report Status PENDING  Incomplete  MRSA PCR Screening     Status: None   Collection Time: 03/08/17  5:30 PM  Result Value Ref Range Status   MRSA by PCR NEGATIVE NEGATIVE Final    Comment:  The GeneXpert MRSA Assay (FDA approved for NASAL specimens only), is one component of a comprehensive MRSA colonization surveillance program. It is not intended to diagnose MRSA infection nor to guide or monitor treatment for MRSA infections. Performed at [facility], [address], [place], [place] [address]     Studies/Results: Ct Angio Head W Or Wo Contrast  Result Date: 03/08/2017 CLINICAL DATA:  27 year old male with possible drug overdose. Right lower extremity weakness. EXAM: CT ANGIOGRAPHY HEAD AND NECK TECHNIQUE: Multidetector CT imaging of the head and neck was performed using the standard protocol during bolus administration of intravenous contrast. Multiplanar CT image reconstructions and MIPs were obtained to evaluate the vascular anatomy. Carotid stenosis measurements (when applicable) are obtained utilizing NASCET criteria, using the distal internal carotid diameter as the denominator. CONTRAST:  80mL ISOVUE-370 IOPAMIDOL (ISOVUE-370) INJECTION 76% COMPARISON:  None. FINDINGS: CT HEAD Brain: No midline shift, ventriculomegaly, mass effect, evidence of mass lesion, intracranial hemorrhage or evidence of cortically based acute infarction. Gray-white matter differentiation is within normal limits throughout the brain. Normal cerebral volume. Calvarium and skull base: Negative. Paranasal sinuses: Clear. Orbits: Visualized orbits and scalp soft tissues are within normal limits. CTA NECK Skeleton: Carious dentition greater on the left side. No acute osseous abnormality identified. Upper  chest: /widespread centrilobular abnormal sub solid pulmonary opacity in both lungs, greater on the left. Superimposed partially visible small cavitary lesion in the anterior right upper lobe (series 9, image 1). No superior mediastinal lymphadenopathy. Other neck: Negative.  No neck mass or lymphadenopathy. Aortic arch: The visible central pulmonary artery is patent. Three vessel arch configuration. No arch atherosclerosis or abnormality. Right carotid system: Normal. Left carotid system: Normal. Vertebral arteries: Normal right vertebral artery to the skull base. Normal left vertebral artery to the skull base. The left vertebral is mildly non dominant. Normal proximal subclavian arteries. CTA HEAD Posterior circulation: Mildly dominant distal right vertebral artery. Normal distal vertebral arteries and vertebrobasilar junction. Normal left PICA origin. The right PICA origin is late but normal. Normal vertebrobasilar junction and basilar artery. AICA, SCA, and right PCA origins are normal. There is a fetal type left PCA origin. The right posterior communicating artery is present. Bilateral PCA branches appear normal. Anterior circulation: Both ICA siphons are patent without atherosclerosis or stenosis. Normal bilateral ophthalmic and posterior communicating artery origins. Patent carotid termini. Dominant right ACA A1 segment, the left is diminutive. Anterior communicating artery and bilateral ACA branches are normal. Left MCA M1 segment, left MCA bifurcation, and left MCA branches are normal. Right MCA M1 segment, right MCA bifurcation, and right MCA branches are normal. Venous sinuses: Patent. The left transverse and sigmoid sinus are dominant. The left IJ is dominant. Anatomic variants: Mildly dominant right vertebral artery. Dominant right and diminutive left ACA A1 segments. Fetal type left PCA origin. Delayed phase: No abnormal enhancement identified. Fifty Review of the MIP images confirms the above findings  IMPRESSION: 1. Normal arterial findings on CTA head and neck and normal CT appearance of the brain. 2. Abnormal lungs with widespread bilateral centrilobular sub solid pulmonary opacity. Consider acute pulmonary edema in this clinical setting, but differential diagnosis would include acute viral/atypical respiratory infection. 3. Small partially visible cavitary lesion in the anterior right upper lobe, probably postinflammatory. Electronically Signed   By: Odessa Fleming M.D.   On: 03/08/2017 14:59   Dg Chest 1 View  Result Date: 03/08/2017 CLINICAL DATA:  Heroin overdose EXAM: PORTABLE CHEST 1 VIEW COMPARISON:  None. FINDINGS: There is bilateral  interstitial and alveolar airspace opacities throughout bilateral lungs. There is no focal consolidation. There is no pleural effusion or pneumothorax. The heart and mediastinal contours are unremarkable. The osseous structures are unremarkable. IMPRESSION: Bilateral interstitial and alveolar airspace opacities throughout bilateral lungs. Differential considerations include pulmonary edema versus multilobar pneumonia. Electronically Signed   By: Elige Ko   On: 03/08/2017 14:22   Ct Angio Neck W And/or Wo Contrast  Result Date: 03/08/2017 CLINICAL DATA:  27 year old male with possible drug overdose. Right lower extremity weakness. EXAM: CT ANGIOGRAPHY HEAD AND NECK TECHNIQUE: Multidetector CT imaging of the head and neck was performed using the standard protocol during bolus administration of intravenous contrast. Multiplanar CT image reconstructions and MIPs were obtained to evaluate the vascular anatomy. Carotid stenosis measurements (when applicable) are obtained utilizing NASCET criteria, using the distal internal carotid diameter as the denominator. CONTRAST:  80mL ISOVUE-370 IOPAMIDOL (ISOVUE-370) INJECTION 76% COMPARISON:  None. FINDINGS: CT HEAD Brain: No midline shift, ventriculomegaly, mass effect, evidence of mass lesion, intracranial hemorrhage or evidence of  cortically based acute infarction. Gray-white matter differentiation is within normal limits throughout the brain. Normal cerebral volume. Calvarium and skull base: Negative. Paranasal sinuses: Clear. Orbits: Visualized orbits and scalp soft tissues are within normal limits. CTA NECK Skeleton: Carious dentition greater on the left side. No acute osseous abnormality identified. Upper chest: /widespread centrilobular abnormal sub solid pulmonary opacity in both lungs, greater on the left. Superimposed partially visible small cavitary lesion in the anterior right upper lobe (series 9, image 1). No superior mediastinal lymphadenopathy. Other neck: Negative.  No neck mass or lymphadenopathy. Aortic arch: The visible central pulmonary artery is patent. Three vessel arch configuration. No arch atherosclerosis or abnormality. Right carotid system: Normal. Left carotid system: Normal. Vertebral arteries: Normal right vertebral artery to the skull base. Normal left vertebral artery to the skull base. The left vertebral is mildly non dominant. Normal proximal subclavian arteries. CTA HEAD Posterior circulation: Mildly dominant distal right vertebral artery. Normal distal vertebral arteries and vertebrobasilar junction. Normal left PICA origin. The right PICA origin is late but normal. Normal vertebrobasilar junction and basilar artery. AICA, SCA, and right PCA origins are normal. There is a fetal type left PCA origin. The right posterior communicating artery is present. Bilateral PCA branches appear normal. Anterior circulation: Both ICA siphons are patent without atherosclerosis or stenosis. Normal bilateral ophthalmic and posterior communicating artery origins. Patent carotid termini. Dominant right ACA A1 segment, the left is diminutive. Anterior communicating artery and bilateral ACA branches are normal. Left MCA M1 segment, left MCA bifurcation, and left MCA branches are normal. Right MCA M1 segment, right MCA  bifurcation, and right MCA branches are normal. Venous sinuses: Patent. The left transverse and sigmoid sinus are dominant. The left IJ is dominant. Anatomic variants: Mildly dominant right vertebral artery. Dominant right and diminutive left ACA A1 segments. Fetal type left PCA origin. Delayed phase: No abnormal enhancement identified. Fifty Review of the MIP images confirms the above findings IMPRESSION: 1. Normal arterial findings on CTA head and neck and normal CT appearance of the brain. 2. Abnormal lungs with widespread bilateral centrilobular sub solid pulmonary opacity. Consider acute pulmonary edema in this clinical setting, but differential diagnosis would include acute viral/atypical respiratory infection. 3. Small partially visible cavitary lesion in the anterior right upper lobe, probably postinflammatory. Electronically Signed   By: Odessa Fleming M.D.   On: 03/08/2017 14:59    Medications:  Prior to Admission:  No medications prior to admission.  Scheduled: . enoxaparin (LOVENOX) injection  40 mg Subcutaneous Q24H   Continuous: . sodium chloride 1,000 mL (03/08/17 1646)  . azithromycin Stopped (03/08/17 1922)  . piperacillin-tazobactam (ZOSYN)  IV 3.375 g (03/09/17 0534)  . vancomycin Stopped (03/09/17 0640)   ZOX:[id number], ondansetron (ZOFRAN) IV  Assesment: This is a 27 year old who has significant history of drug abuse including IV drug abuse and who has what appears to be a small partially visible cavitary lesion in the anterior right upper lobe.  This was on CT of the neck and I have personally reviewed that film.  Agree he needs to have a CT of the lung.  He has diffuse infiltrates on chest x-ray which could be pulmonary edema from his drug use but also could be infection particularly since he vomited and was unconscious he certainly could have aspiration pneumonia for which she is being appropriately treated.  He did have septic emboli but his blood cultures are  negative so far.  He insists that he is going home today Principal Problem:   Weakness Active Problems:   Cocaine abuse (HCC)   IVDU (intravenous drug user)   Tachycardia   Overdose   Abnormal liver function    Plan: If he will allow would continue with plans for CT chest.  Would continue Zosyn and vancomycin.  If he has positive blood cultures he would need TEE  I will be out of town 03/10/2017 and 03/11/2017 and return on 03/12/2017  LOS: 1 day   Shahidah Nesbitt L 03/09/2017, 7:50 AM

## 2017-03-09 NOTE — Consult Note (Deleted)
   Cardiology consultation request received for elevated troponin. 27 yo male history of IV drug abuse admitted with overdose cocaine +, benzo +, THC + on admission. Presented hypoxic and hypotensive. Also with bilateral interstital opacities likely pneumonia. In this setting trop elevation to 0.48. EKG without acute ischemic changes.   We will f/u echo results, young male with very low risk for CAD with clear alternative etiology for troponin elevation with cocarine+ initial hypotension and hypoxia. Would continue to cycle troponins to establish peak.  If normal echo no further cardiac testing or interventions are planned. If abnormal echo or severe trop elevation then full cardiology consult to follow   Signed, Dina RichBranch, Chelcey Caputo, MD  03/09/2017 8:14 AM

## 2017-03-09 NOTE — Clinical Social Work Note (Signed)
Pt is a 27 year old male admitted with drug overdose. In Progression today, MD asked CSW to bring pt treatment resource information. Attempted to see pt today though he was sleeping and not easily awakened. Left information about substance abuse and treatment information in pt's room. Will follow up tomorrow if pt remains hospitalized at that time.

## 2017-03-09 NOTE — Progress Notes (Signed)
PROGRESS NOTE  Kenneth Davis WUJ:[id number] DOB: January 09, 1990 DOA: 03/08/2017 PCP: Patient, No Pcp Per  Brief History:  27 year old male with polysubstance abuse, septic emboli to the lungs presenting with an episode of unresponsiveness.  The patient's history is inconsistent.  He does not recall the events surrounding his drug overdose.  Apparently, a bystander called EMS when the patient was found unresponsive.  The patient stated that he bought some heroin from his dealer and injected as his usual routine.  For some reason, he did not have the same type of euphoria.  As result, he somehow thought that he was injecting horse tranquilizer.  Upon further questioning, the patient was unable to clarify or give more details regarding the events surrounding his intravenous drug use on 03/08/2017.  He was not able to tell me why he felt like he was injecting horse tranquilizer.  He denied using any other injectable drugs on 03/08/2017, but he stated that he has been smoking crack cocaine and using alprazolam.  Upon EMS arrival, the patient had oxygen saturation of 83%.  Apparently, a male by standard had told EMS the patient used horse tranquilizer.  The patient was placed on nonrebreather with improvement of his oxygen saturation.  The patient denies any recent fevers, chills, headache, chest pain, shortness breath, hemoptysis, nausea, vomiting, diarrhea, abdominal pain.  On the morning of 03/09/2017, the patient was alert and oriented times 4.  He denied any homicidal or suicidal ideation.  He denied intentional overdose.  The patient stated that he was "just trying to have fun".  Upon presentation, the patient was hypoxemic and hypotensive with blood pressure of 82/65.  CT angiogram of the head and neck was negative for any hemodynamically significant lesions.  His CT of the brain was negative.  There was incidental finding of widespread bilateral centrilobular pulmonary opacities left greater than right with  small cavitary lesion on the right upper lobe.  Assessment/Plan: Acute toxic encephalopathy -Secondary to illicit drug overdose--unclear what was injected -pt evasive upon questioning -improved, back to baseline  Accidental Drug Overdose -unclear what was injected -pt evasive upon questioning -improved, back to baseline -Patient is now back to baseline and hemodynamically stable  Right lower extremity weakness -Concerned about septic emboli to the brain -MRI brain -Check CPK  Cavitary lung lesions/aspiration pneumonitis -Continue vancomycin and Zosyn pending culture data -Discontinue azithromycin -Follow blood cultures -CT chest -Suspect the patient has septic emboli to the lung -Echocardiogram -Influenza negative -Influenza PCR negative -procalcitonin  Elevated troponin -No chest pain presently -Secondary to demand ischemia in the setting of drug overdose -Echocardiogram  Transaminasemia -Hepatitis B surface antigen -Hepatitis C antibody -HIV antibody -RUQ [place] neg  Polysubstance abuse -Including heroin, tobacco, cocaine     Disposition Plan:   Home in 3-4 days  Family Communication: No  Family at bedside  Consultants:  none  Code Status:  FULL  DVT Prophylaxis:  [place] Lovenox   Procedures: As Listed in Progress Note Above  Antibiotics: vanco 3/3>>> Zosyn 3/3>>> Azithromycin 3/3>>>    Subjective: Patient denies fevers, chills, headache, chest pain, dyspnea, nausea, vomiting, diarrhea, abdominal pain, dysuria, hematuria, hematochezia, and melena.   Objective: Vitals:   03/09/17 0600 03/09/17 0700 03/09/17 0800 03/09/17 0830  BP: 110/61 105/67 112/82   Pulse: 84 80 91   Resp: 20 15 17    Temp:    97.9 F (36.6 C)  TempSrc:    Oral  SpO2: 99% 98% 90%  Weight:      Height:        Intake/Output Summary (Last 24 hours) at 03/09/2017 1043 Last data filed at 03/09/2017 0912 Gross per 24 hour  Intake 2672.5 ml  Output 4725 ml  Net -2052.5 ml    Weight change:  Exam:   General:  Pt is alert, follows commands appropriately, not in acute distress  HEENT: No icterus, No thrush, No neck mass, [place]/AT  Cardiovascular: RRR, S1/S2, no rubs, no gallops  Respiratory: Bibasilar crackles, left greater than right.  Diminished breath sounds at the bases.  No wheezing.  Abdomen: Soft/+BS, non tender, non distended, no guarding  Extremities: No edema, No lymphangitis, No petechiae, No rashes, no synovitis   Data Reviewed: I have personally reviewed following labs and imaging studies Basic Metabolic Panel: Recent Labs  Lab 03/08/17 1339 03/08/17 1349 03/09/17 0158  NA 135 137 133*  K 5.2* 5.1 4.2  CL 102 102 100*  CO2 23  --  24  GLUCOSE 144* 126* 111*  BUN 12 12 9   CREATININE 1.24 1.20 1.00  CALCIUM 8.6*  --  8.5*   Liver Function Tests: Recent Labs  Lab 03/08/17 1339 03/09/17 0158  AST 53* 77*  ALT 49 50  ALKPHOS 63 43  BILITOT 0.6 1.3*  PROT 7.2 6.1*  ALBUMIN 3.8 3.2*   No results for input(s): LIPASE, AMYLASE in the last 168 hours. No results for input(s): AMMONIA in the last 168 hours. Coagulation Profile: No results for input(s): INR, PROTIME in the last 168 hours. CBC: Recent Labs  Lab 03/08/17 1320 03/08/17 1349 03/09/17 0158  WBC 13.3*  --  13.3*  NEUTROABS 11.1*  --   --   HGB 16.9 16.3 13.9  HCT 50.2 48.0 41.2  MCV 92.3  --  92.8  PLT 162  --  130*   Cardiac Enzymes: Recent Labs  Lab 03/08/17 1326 03/08/17 1734 03/09/17 0158  TROPONINI 0.12* 0.31* 0.48*   BNP: Invalid input(s): POCBNP CBG: No results for input(s): GLUCAP in the last 168 hours. HbA1C: No results for input(s): HGBA1C in the last 72 hours. Urine analysis:    Component Value Date/Time   COLORURINE ORANGE (A) 08/31/2015 1725   APPEARANCEUR CLEAR 08/31/2015 1725   LABSPEC 1.029 08/31/2015 1725   PHURINE 6.0 08/31/2015 1725   GLUCOSEU 100 (A) 08/31/2015 1725   HGBUR NEGATIVE 08/31/2015 1725   BILIRUBINUR SMALL (A)  08/31/2015 1725   KETONESUR 15 (A) 08/31/2015 1725   PROTEINUR 100 (A) 08/31/2015 1725   NITRITE NEGATIVE 08/31/2015 1725   LEUKOCYTESUR TRACE (A) 08/31/2015 1725   Sepsis Labs: @LABRCNTIP (procalcitonin:4,lacticidven:4) ) Recent Results (from the past 240 hour(s))  Culture, blood (routine x 2)     Status: None (Preliminary result)   Collection Time: 03/08/17  1:36 PM  Result Value Ref Range Status   Specimen Description BLOOD LEFT HAND  Final   Special Requests   Final    BOTTLES DRAWN AEROBIC AND ANAEROBIC Blood Culture results may not be optimal due to an inadequate volume of blood received in culture bottles   Culture   Final    NO GROWTH < 24 HOURS Performed at [facility], [address] [place], [place] [address]    Report Status PENDING  Incomplete  Culture, blood (routine x 2)     Status: None (Preliminary result)   Collection Time: 03/08/17  1:36 PM  Result Value Ref Range Status   Specimen Description BLOOD LEFT HAND  Final   Special  Requests   Final    BOTTLES DRAWN AEROBIC AND ANAEROBIC Blood Culture adequate volume   Culture   Final    NO GROWTH < 24 HOURS Performed at [facility], [address] [place], [place] [address]    Report Status PENDING  Incomplete  MRSA PCR Screening     Status: None   Collection Time: 03/08/17  5:30 PM  Result Value Ref Range Status   MRSA by PCR NEGATIVE NEGATIVE Final    Comment:        The GeneXpert MRSA Assay (FDA approved for NASAL specimens only), is one component of a comprehensive MRSA colonization surveillance program. It is not intended to diagnose MRSA infection nor to guide or monitor treatment for MRSA infections. Performed at [facility], [address] [place], [place] [address]      Scheduled Meds: . enoxaparin (LOVENOX) injection  40 mg Subcutaneous Q24H   Continuous Infusions: . sodium chloride 1,000 mL (03/08/17 1646)  . azithromycin Stopped (03/08/17 1922)  . piperacillin-tazobactam (ZOSYN)  IV  Stopped (03/09/17 0911)  . vancomycin Stopped (03/09/17 0640)    Procedures/Studies: Ct Angio Head W Or Wo Contrast  Result Date: 03/08/2017 CLINICAL DATA:  27 year old male with possible drug overdose. Right lower extremity weakness. EXAM: CT ANGIOGRAPHY HEAD AND NECK TECHNIQUE: Multidetector CT imaging of the head and neck was performed using the standard protocol during bolus administration of intravenous contrast. Multiplanar CT image reconstructions and MIPs were obtained to evaluate the vascular anatomy. Carotid stenosis measurements (when applicable) are obtained utilizing NASCET criteria, using the distal internal carotid diameter as the denominator. CONTRAST:  80mL ISOVUE-370 IOPAMIDOL (ISOVUE-370) INJECTION 76% COMPARISON:  None. FINDINGS: CT HEAD Brain: No midline shift, ventriculomegaly, mass effect, evidence of mass lesion, intracranial hemorrhage or evidence of cortically based acute infarction. Gray-white matter differentiation is within normal limits throughout the brain. Normal cerebral volume. Calvarium and skull base: Negative. Paranasal sinuses: Clear. Orbits: Visualized orbits and scalp soft tissues are within normal limits. CTA NECK Skeleton: Carious dentition greater on the left side. No acute osseous abnormality identified. Upper chest: /widespread centrilobular abnormal sub solid pulmonary opacity in both lungs, greater on the left. Superimposed partially visible small cavitary lesion in the anterior right upper lobe (series 9, image 1). No superior mediastinal lymphadenopathy. Other neck: Negative.  No neck mass or lymphadenopathy. Aortic arch: The visible central pulmonary artery is patent. Three vessel arch configuration. No arch atherosclerosis or abnormality. Right carotid system: Normal. Left carotid system: Normal. Vertebral arteries: Normal right vertebral artery to the skull base. Normal left vertebral artery to the skull base. The left vertebral is mildly non dominant. Normal  proximal subclavian arteries. CTA HEAD Posterior circulation: Mildly dominant distal right vertebral artery. Normal distal vertebral arteries and vertebrobasilar junction. Normal left PICA origin. The right PICA origin is late but normal. Normal vertebrobasilar junction and basilar artery. AICA, SCA, and right PCA origins are normal. There is a fetal type left PCA origin. The right posterior communicating artery is present. Bilateral PCA branches appear normal. Anterior circulation: Both ICA siphons are patent without atherosclerosis or stenosis. Normal bilateral ophthalmic and posterior communicating artery origins. Patent carotid termini. Dominant right ACA A1 segment, the left is diminutive. Anterior communicating artery and bilateral ACA branches are normal. Left MCA M1 segment, left MCA bifurcation, and left MCA branches are normal. Right MCA M1 segment, right MCA bifurcation, and right MCA branches are normal. Venous sinuses: Patent. The left transverse and sigmoid sinus are dominant. The left  IJ is dominant. Anatomic variants: Mildly dominant right vertebral artery. Dominant right and diminutive left ACA A1 segments. Fetal type left PCA origin. Delayed phase: No abnormal enhancement identified. Fifty Review of the MIP images confirms the above findings IMPRESSION: 1. Normal arterial findings on CTA head and neck and normal CT appearance of the brain. 2. Abnormal lungs with widespread bilateral centrilobular sub solid pulmonary opacity. Consider acute pulmonary edema in this clinical setting, but differential diagnosis would include acute viral/atypical respiratory infection. 3. Small partially visible cavitary lesion in the anterior right upper lobe, probably postinflammatory. Electronically Signed   By: Odessa Fleming M.D.   On: 03/08/2017 14:59   Dg Chest 1 View  Result Date: 03/08/2017 CLINICAL DATA:  Heroin overdose EXAM: PORTABLE CHEST 1 VIEW COMPARISON:  None. FINDINGS: There is bilateral interstitial and  alveolar airspace opacities throughout bilateral lungs. There is no focal consolidation. There is no pleural effusion or pneumothorax. The heart and mediastinal contours are unremarkable. The osseous structures are unremarkable. IMPRESSION: Bilateral interstitial and alveolar airspace opacities throughout bilateral lungs. Differential considerations include pulmonary edema versus multilobar pneumonia. Electronically Signed   By: Elige Ko   On: 03/08/2017 14:22   Ct Angio Neck W And/or Wo Contrast  Result Date: 03/08/2017 CLINICAL DATA:  27 year old male with possible drug overdose. Right lower extremity weakness. EXAM: CT ANGIOGRAPHY HEAD AND NECK TECHNIQUE: Multidetector CT imaging of the head and neck was performed using the standard protocol during bolus administration of intravenous contrast. Multiplanar CT image reconstructions and MIPs were obtained to evaluate the vascular anatomy. Carotid stenosis measurements (when applicable) are obtained utilizing NASCET criteria, using the distal internal carotid diameter as the denominator. CONTRAST:  80mL ISOVUE-370 IOPAMIDOL (ISOVUE-370) INJECTION 76% COMPARISON:  None. FINDINGS: CT HEAD Brain: No midline shift, ventriculomegaly, mass effect, evidence of mass lesion, intracranial hemorrhage or evidence of cortically based acute infarction. Gray-white matter differentiation is within normal limits throughout the brain. Normal cerebral volume. Calvarium and skull base: Negative. Paranasal sinuses: Clear. Orbits: Visualized orbits and scalp soft tissues are within normal limits. CTA NECK Skeleton: Carious dentition greater on the left side. No acute osseous abnormality identified. Upper chest: /widespread centrilobular abnormal sub solid pulmonary opacity in both lungs, greater on the left. Superimposed partially visible small cavitary lesion in the anterior right upper lobe (series 9, image 1). No superior mediastinal lymphadenopathy. Other neck: Negative.  No  neck mass or lymphadenopathy. Aortic arch: The visible central pulmonary artery is patent. Three vessel arch configuration. No arch atherosclerosis or abnormality. Right carotid system: Normal. Left carotid system: Normal. Vertebral arteries: Normal right vertebral artery to the skull base. Normal left vertebral artery to the skull base. The left vertebral is mildly non dominant. Normal proximal subclavian arteries. CTA HEAD Posterior circulation: Mildly dominant distal right vertebral artery. Normal distal vertebral arteries and vertebrobasilar junction. Normal left PICA origin. The right PICA origin is late but normal. Normal vertebrobasilar junction and basilar artery. AICA, SCA, and right PCA origins are normal. There is a fetal type left PCA origin. The right posterior communicating artery is present. Bilateral PCA branches appear normal. Anterior circulation: Both ICA siphons are patent without atherosclerosis or stenosis. Normal bilateral ophthalmic and posterior communicating artery origins. Patent carotid termini. Dominant right ACA A1 segment, the left is diminutive. Anterior communicating artery and bilateral ACA branches are normal. Left MCA M1 segment, left MCA bifurcation, and left MCA branches are normal. Right MCA M1 segment, right MCA bifurcation, and right MCA branches are normal. Venous  sinuses: Patent. The left transverse and sigmoid sinus are dominant. The left IJ is dominant. Anatomic variants: Mildly dominant right vertebral artery. Dominant right and diminutive left ACA A1 segments. Fetal type left PCA origin. Delayed phase: No abnormal enhancement identified. Fifty Review of the MIP images confirms the above findings IMPRESSION: 1. Normal arterial findings on CTA head and neck and normal CT appearance of the brain. 2. Abnormal lungs with widespread bilateral centrilobular sub solid pulmonary opacity. Consider acute pulmonary edema in this clinical setting, but differential diagnosis would  include acute viral/atypical respiratory infection. 3. Small partially visible cavitary lesion in the anterior right upper lobe, probably postinflammatory. Electronically Signed   By: Odessa Fleming M.D.   On: 03/08/2017 14:59   US Abdomen Limited Ruq  Result Date: 03/09/2017 CLINICAL DATA:  Abnormal liver function tests. EXAM: ULTRASOUND ABDOMEN LIMITED RIGHT UPPER QUADRANT COMPARISON:  None. FINDINGS: Gallbladder: No gallstones or wall thickening visualized. No sonographic Murphy sign noted by sonographer. Common bile duct: Diameter: 3 mm Liver: No focal lesion identified. Within normal limits in parenchymal echogenicity. Portal vein is patent on color Doppler imaging with normal direction of blood flow towards the liver. IMPRESSION: Normal right upper quadrant ultrasound. Electronically Signed   By: Bary Richard M.D.   On: 03/09/2017 08:45    Catarina Hartshorn, DO  [facility] Pager [phone]  If 7PM-7AM, please contact night-coverage [email or web address] Password [email or web address] 03/09/2017, 10:43 AM   LOS: 1 day

## 2017-03-09 NOTE — Progress Notes (Signed)
Went in to introduce myself to patient. Patient had head covered and said he wasn't talking to anyone. I asked him how he felt, he responded "amazing". I then asked about his pain he said that he had none. As I was leaving he said "I still need my pain pill though."

## 2017-03-09 NOTE — Progress Notes (Signed)
Pt now refusing lab work

## 2017-03-09 NOTE — Progress Notes (Deleted)
SATURATION QUALIFICATIONS: (This note is used to comply with regulatory documentation for home oxygen)  Patient Saturations on Room Air at Rest = 88%  Patient Saturations on Room Air while Ambulating = 84%  Patient Saturations on 4 Liters of oxygen while Ambulating = 96%

## 2017-03-11 LAB — LEGIONELLA PNEUMOPHILA SEROGP 1 UR AG: L. pneumophila Serogp 1 Ur Ag: NEGATIVE

## 2017-03-13 LAB — CULTURE, BLOOD (ROUTINE X 2)
CULTURE: NO GROWTH
Culture: NO GROWTH
Special Requests: ADEQUATE

## 2017-07-01 IMAGING — CR DG CHEST 2V
2 series · 2 of 2 positions shown · non-contrast
Comparison: CT chest and PA and lateral chest 08/31/2015.

CLINICAL DATA: Cough and shortness of breath for 4 days. Recent
diagnosis of pneumonia.

EXAM:
CHEST  2 VIEW

[chest pa]
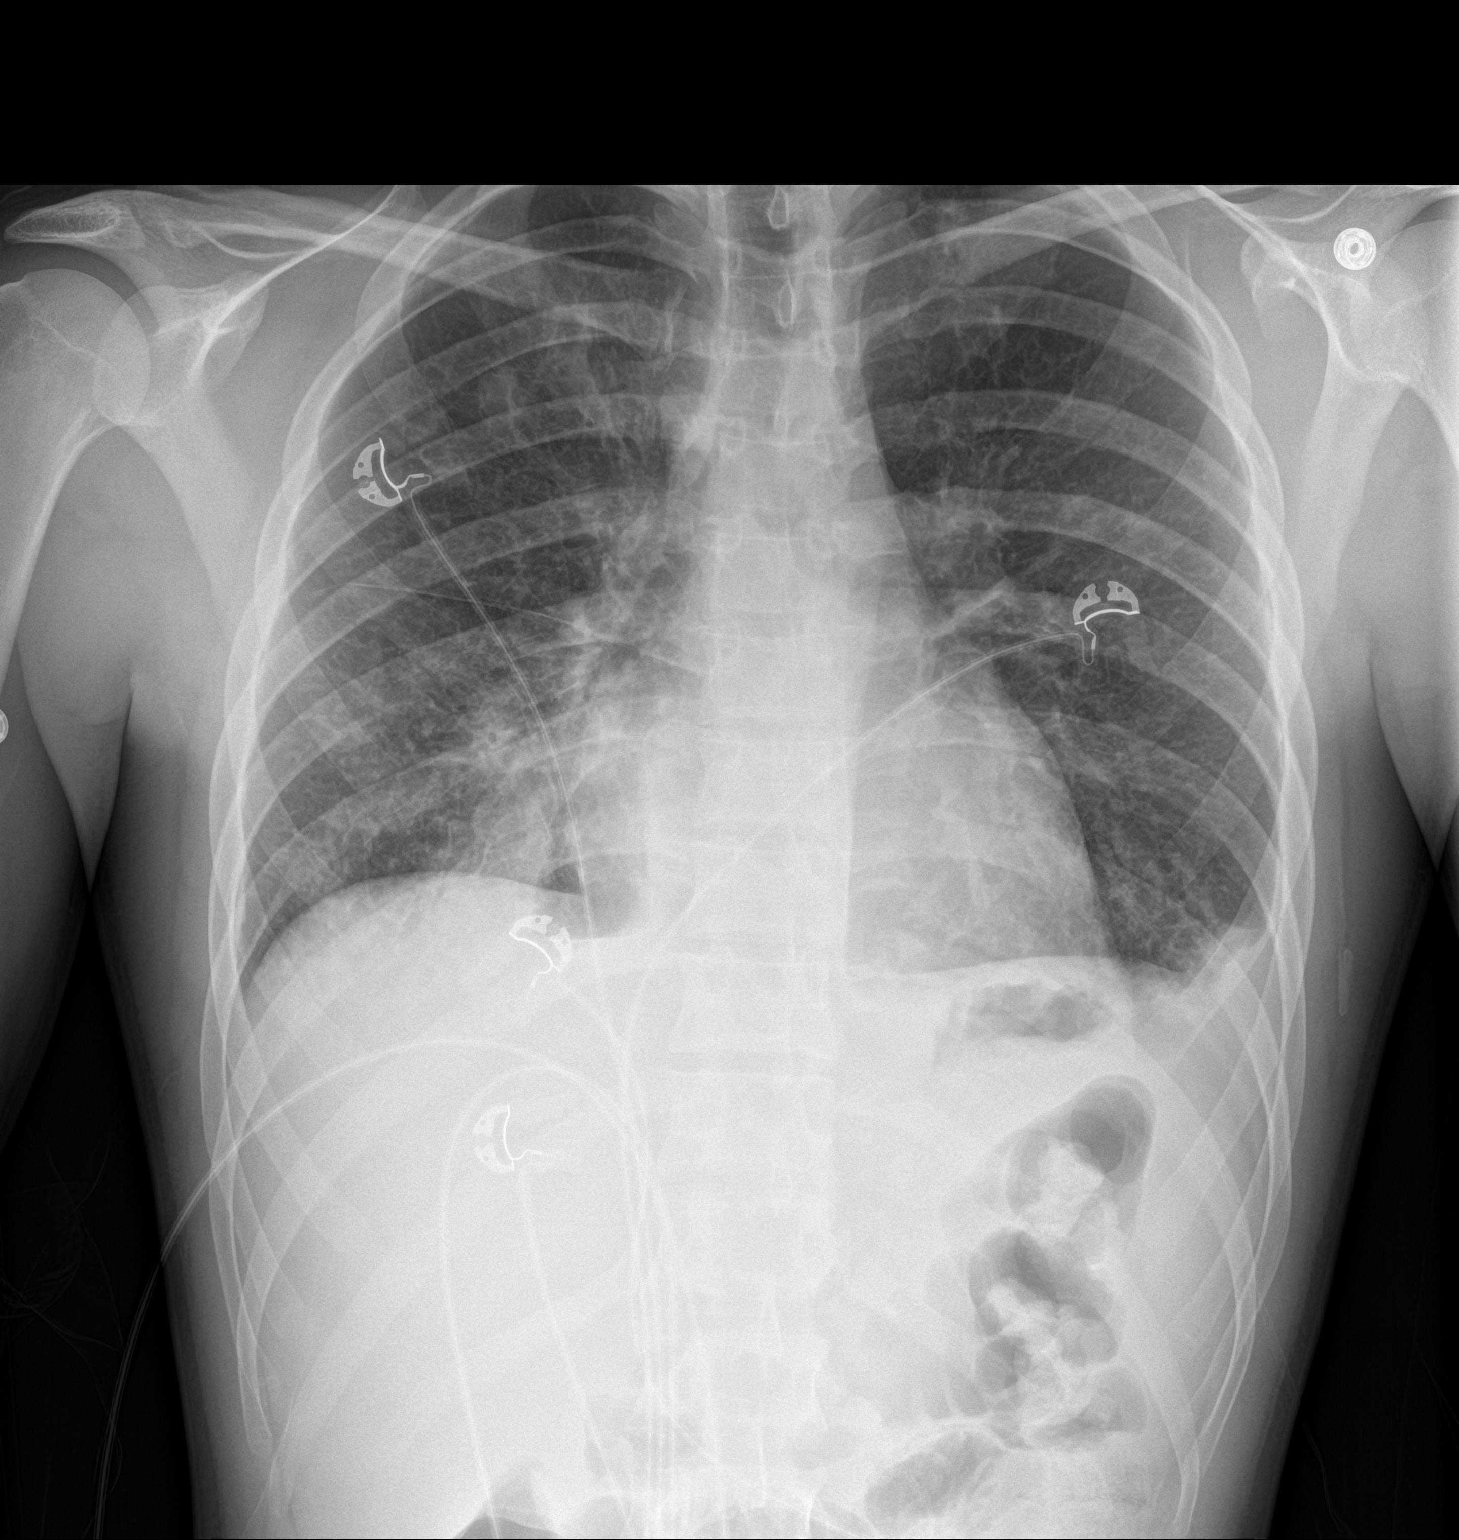

[chest lat]
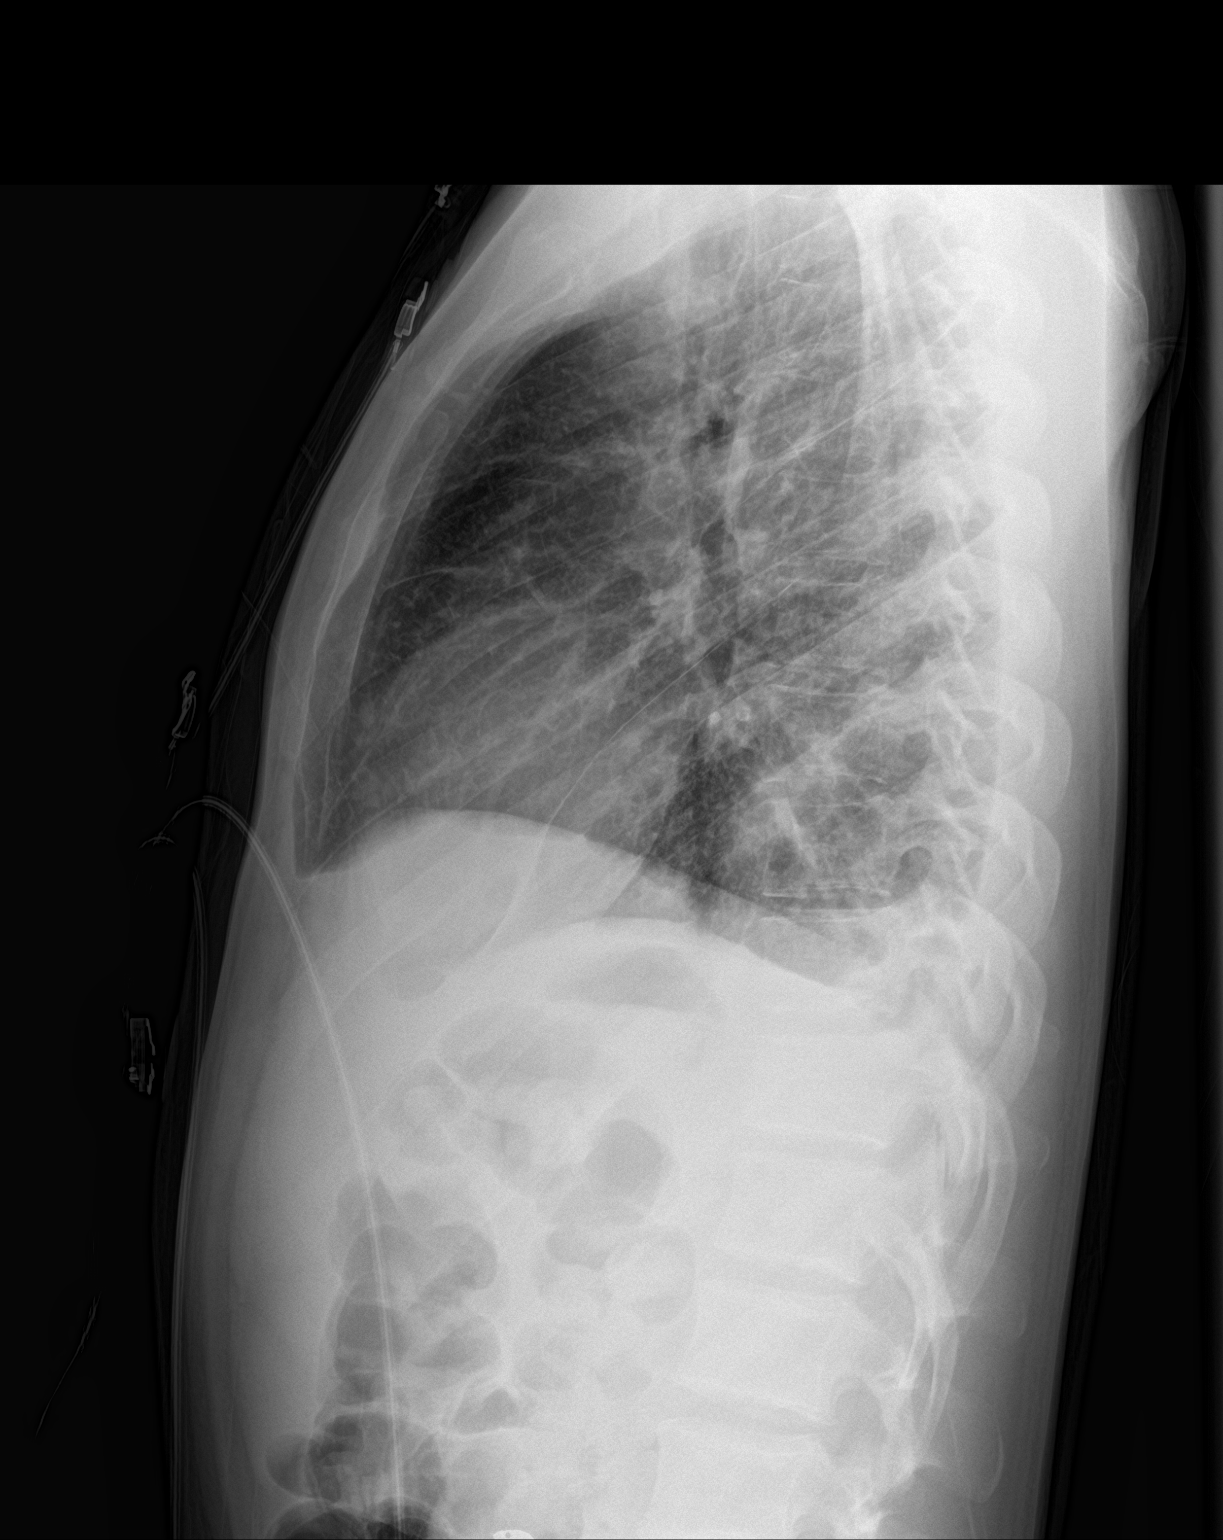

[2 of 2 positions shown; findings below may reference images not displayed]

FINDINGS: There has been interval worsening of right lower lobe airspace
disease. New patchy airspace disease is seen in the left lung base.
Small bilateral pleural effusions, greater on the left, appear
increased in size. Patchy airspace opacity in the right upper lobe
is unchanged.
IMPRESSION: Increased right greater than left basilar airspace disease. Right
upper lobe opacity is not notably changed.

Increased small bilateral pleural effusions, greater on the right.

## 2017-12-06 DEATH — deceased
